# Patient Record
Sex: Female | Born: 1955 | Race: White | Hispanic: No | Smoking: Former smoker
Health system: Southern US, Community
[De-identification: ages and names within clinical notes are randomized; demographics above are authoritative.]

## PROBLEM LIST (undated history)

## (undated) DIAGNOSIS — R7303 Prediabetes: Secondary | ICD-10-CM

## (undated) DIAGNOSIS — I10 Essential (primary) hypertension: Secondary | ICD-10-CM

## (undated) DIAGNOSIS — F329 Major depressive disorder, single episode, unspecified: Secondary | ICD-10-CM

## (undated) DIAGNOSIS — K219 Gastro-esophageal reflux disease without esophagitis: Secondary | ICD-10-CM

## (undated) DIAGNOSIS — K449 Diaphragmatic hernia without obstruction or gangrene: Secondary | ICD-10-CM

## (undated) DIAGNOSIS — Z9889 Other specified postprocedural states: Secondary | ICD-10-CM

## (undated) DIAGNOSIS — F32A Depression, unspecified: Secondary | ICD-10-CM

## (undated) DIAGNOSIS — F419 Anxiety disorder, unspecified: Secondary | ICD-10-CM

## (undated) DIAGNOSIS — C801 Malignant (primary) neoplasm, unspecified: Secondary | ICD-10-CM

## (undated) DIAGNOSIS — M4800 Spinal stenosis, site unspecified: Secondary | ICD-10-CM

## (undated) DIAGNOSIS — L989 Disorder of the skin and subcutaneous tissue, unspecified: Secondary | ICD-10-CM

## (undated) DIAGNOSIS — R112 Nausea with vomiting, unspecified: Secondary | ICD-10-CM

## (undated) DIAGNOSIS — R0609 Other forms of dyspnea: Secondary | ICD-10-CM

## (undated) DIAGNOSIS — R519 Headache, unspecified: Secondary | ICD-10-CM

## (undated) DIAGNOSIS — M4310 Spondylolisthesis, site unspecified: Secondary | ICD-10-CM

## (undated) DIAGNOSIS — M1990 Unspecified osteoarthritis, unspecified site: Secondary | ICD-10-CM

## (undated) DIAGNOSIS — M5416 Radiculopathy, lumbar region: Secondary | ICD-10-CM

## (undated) HISTORY — PX: NO PAST SURGERIES: SHX2092

## (undated) HISTORY — PX: JOINT REPLACEMENT: SHX530

## (undated) HISTORY — PX: BACK SURGERY: SHX140

## (undated) HISTORY — PX: WISDOM TOOTH EXTRACTION: SHX21

---
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
## 2004-06-29 ENCOUNTER — Other Ambulatory Visit: Payer: Self-pay

## 2004-06-29 ENCOUNTER — Observation Stay: Payer: Self-pay | Admitting: Anesthesiology

## 2015-04-17 ENCOUNTER — Other Ambulatory Visit: Payer: Self-pay | Admitting: Internal Medicine

## 2015-04-17 DIAGNOSIS — M25512 Pain in left shoulder: Secondary | ICD-10-CM

## 2015-04-17 DIAGNOSIS — G8911 Acute pain due to trauma: Secondary | ICD-10-CM

## 2015-05-14 ENCOUNTER — Ambulatory Visit
Admission: RE | Admit: 2015-05-14 | Discharge: 2015-05-14 | Disposition: A | Discharge status: Home / Self Care | Payer: BC Managed Care – PPO | Source: Ambulatory Visit | Attending: Internal Medicine | Admitting: Internal Medicine

## 2015-05-14 DIAGNOSIS — M25512 Pain in left shoulder: Secondary | ICD-10-CM | POA: Diagnosis present

## 2015-05-14 DIAGNOSIS — G8911 Acute pain due to trauma: Secondary | ICD-10-CM | POA: Diagnosis not present

## 2015-05-14 DIAGNOSIS — R531 Weakness: Secondary | ICD-10-CM | POA: Insufficient documentation

## 2015-05-14 DIAGNOSIS — X58XXXA Exposure to other specified factors, initial encounter: Secondary | ICD-10-CM | POA: Insufficient documentation

## 2015-05-14 DIAGNOSIS — S43439A Superior glenoid labrum lesion of unspecified shoulder, initial encounter: Secondary | ICD-10-CM | POA: Insufficient documentation

## 2015-05-14 DIAGNOSIS — M19012 Primary osteoarthritis, left shoulder: Secondary | ICD-10-CM | POA: Insufficient documentation

## 2015-05-14 DIAGNOSIS — M7552 Bursitis of left shoulder: Secondary | ICD-10-CM | POA: Diagnosis not present

## 2016-10-21 DIAGNOSIS — I10 Essential (primary) hypertension: Secondary | ICD-10-CM | POA: Insufficient documentation

## 2017-01-18 DIAGNOSIS — F411 Generalized anxiety disorder: Secondary | ICD-10-CM | POA: Insufficient documentation

## 2017-02-02 LAB — HM DEXA SCAN: HM Dexa Scan: NORMAL

## 2017-02-14 ENCOUNTER — Emergency Department
Admission: EM | Admit: 2017-02-14 | Discharge: 2017-02-14 | Disposition: A | Discharge status: Home / Self Care | Payer: Worker's Compensation | Attending: Emergency Medicine | Admitting: Emergency Medicine

## 2017-02-14 ENCOUNTER — Encounter: Payer: Self-pay | Admitting: Emergency Medicine

## 2017-02-14 ENCOUNTER — Emergency Department: Payer: Worker's Compensation

## 2017-02-14 DIAGNOSIS — I10 Essential (primary) hypertension: Secondary | ICD-10-CM | POA: Insufficient documentation

## 2017-02-14 DIAGNOSIS — M545 Low back pain: Secondary | ICD-10-CM | POA: Insufficient documentation

## 2017-02-14 DIAGNOSIS — M25551 Pain in right hip: Secondary | ICD-10-CM | POA: Insufficient documentation

## 2017-02-14 DIAGNOSIS — Z87891 Personal history of nicotine dependence: Secondary | ICD-10-CM | POA: Insufficient documentation

## 2017-02-14 DIAGNOSIS — M549 Dorsalgia, unspecified: Secondary | ICD-10-CM

## 2017-02-14 HISTORY — DX: Unspecified osteoarthritis, unspecified site: M19.90

## 2017-02-14 HISTORY — DX: Essential (primary) hypertension: I10

## 2017-02-14 MED ORDER — NAPROXEN 500 MG PO TABS: 500.0000 mg | ORAL_TABLET | Freq: Two times a day (BID) | ORAL | 0 refills | Status: AC

## 2017-02-14 MED ORDER — HYDROMORPHONE HCL 1 MG/ML IJ SOLN
INTRAMUSCULAR | Status: AC
  Administered 2017-02-14: 1 mg via INTRAVENOUS
  Filled 2017-02-14: qty 1

## 2017-02-14 MED ORDER — KETOROLAC TROMETHAMINE 30 MG/ML IJ SOLN
30.0000 mg | Freq: Once | INTRAMUSCULAR | Status: AC
  Administered 2017-02-14: 30 mg via INTRAVENOUS
  Filled 2017-02-14: qty 1

## 2017-02-14 MED ORDER — HYDROMORPHONE HCL 1 MG/ML IJ SOLN
1.0000 mg | Freq: Once | INTRAMUSCULAR | Status: AC
  Administered 2017-02-14: 1 mg via INTRAVENOUS
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
## 2017-02-14 NOTE — ED Notes (Signed)
Resumed care from [facility].  Pt alert.  Family with pt.  Pt has back pain and right hip pain.  Pt waiting on xray.

## 2017-02-14 NOTE — ED Notes (Signed)
States feeling better after meds   Family with pt.

## 2017-02-14 NOTE — Discharge Instructions (Signed)
Please seek medical attention for any high fevers, chest pain, shortness of breath, change in behavior, persistent vomiting, bloody stool or any other new or concerning symptoms.  
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
## 2017-03-10 ENCOUNTER — Emergency Department
Admission: EM | Admit: 2017-03-10 | Discharge: 2017-03-10 | Disposition: A | Discharge status: Home / Self Care | Payer: BC Managed Care – PPO | Attending: Emergency Medicine | Admitting: Emergency Medicine

## 2017-03-10 ENCOUNTER — Encounter: Payer: Self-pay | Admitting: Emergency Medicine

## 2017-03-10 DIAGNOSIS — I9589 Other hypotension: Secondary | ICD-10-CM | POA: Insufficient documentation

## 2017-03-10 DIAGNOSIS — Z87891 Personal history of nicotine dependence: Secondary | ICD-10-CM | POA: Insufficient documentation

## 2017-03-10 DIAGNOSIS — Z79899 Other long term (current) drug therapy: Secondary | ICD-10-CM | POA: Insufficient documentation

## 2017-03-10 DIAGNOSIS — E861 Hypovolemia: Secondary | ICD-10-CM | POA: Diagnosis not present

## 2017-03-10 DIAGNOSIS — E860 Dehydration: Secondary | ICD-10-CM | POA: Diagnosis not present

## 2017-03-10 DIAGNOSIS — I959 Hypotension, unspecified: Secondary | ICD-10-CM | POA: Diagnosis present

## 2017-03-10 DIAGNOSIS — I10 Essential (primary) hypertension: Secondary | ICD-10-CM | POA: Insufficient documentation

## 2017-03-10 LAB — CBC
HEMATOCRIT: 46 % (ref 35.0–47.0)
HEMOGLOBIN: 15.3 g/dL (ref 12.0–16.0)
MCH: 28.3 pg (ref 26.0–34.0)
MCHC: 33.3 g/dL (ref 32.0–36.0)
MCV: 84.8 fL (ref 80.0–100.0)
Platelets: 210 10*3/uL (ref 150–440)
RBC: 5.42 MIL/uL — ABNORMAL HIGH (ref 3.80–5.20)
RDW: 13.2 % (ref 11.5–14.5)
WBC: 12.2 10*3/uL — ABNORMAL HIGH (ref 3.6–11.0)

## 2017-03-10 LAB — URINALYSIS, COMPLETE (UACMP) WITH MICROSCOPIC
BACTERIA UA: NONE SEEN
BILIRUBIN URINE: NEGATIVE
GLUCOSE, UA: NEGATIVE mg/dL
HGB URINE DIPSTICK: NEGATIVE
Ketones, ur: NEGATIVE mg/dL
LEUKOCYTES UA: NEGATIVE
NITRITE: NEGATIVE
Protein, ur: NEGATIVE mg/dL
SPECIFIC GRAVITY, URINE: 1.005 (ref 1.005–1.030)
pH: 5 (ref 5.0–8.0)

## 2017-03-10 LAB — BASIC METABOLIC PANEL
Anion gap: 9 (ref 5–15)
BUN: 17 mg/dL (ref 6–20)
CHLORIDE: 104 mmol/L (ref 101–111)
CO2: 24 mmol/L (ref 22–32)
Calcium: 8.9 mg/dL (ref 8.9–10.3)
Creatinine, Ser: 0.97 mg/dL (ref 0.44–1.00)
GFR calc Af Amer: >60 mL/min (ref >60)
GFR calc non Af Amer: >60 mL/min (ref >60)
GLUCOSE: 115 mg/dL — AB (ref 65–99)
POTASSIUM: 4 mmol/L (ref 3.5–5.1)
Sodium: 137 mmol/L (ref 135–145)

## 2017-03-10 LAB — TROPONIN I

## 2017-03-10 MED ORDER — AMLODIPINE BESYLATE 2.5 MG PO TABS: 2.5000 mg | ORAL_TABLET | Freq: Every day | ORAL | 0 refills | Status: DC

## 2017-03-10 MED ORDER — SODIUM CHLORIDE 0.9 % IV BOLUS (SEPSIS)
1000.0000 mL | Freq: Once | INTRAVENOUS | Status: AC
  Administered 2017-03-10: 1000 mL via INTRAVENOUS

## 2017-03-10 NOTE — ED Triage Notes (Signed)
Pt in via POV; sent over for [facility] GI office due to hypotension, reported 69/54.  Pt reports being there for an initial consult, feeling dizzy, weak, diaphoretic this morning.  Pt reports just recently taking her morning medication, recent increase in BP medication.  BP 85/58 upon arrival, other vitals WDL.  Pt alert, NAD noted at this time.

## 2017-03-10 NOTE — ED Notes (Signed)
AAOx3.  Skin warm and dry.  NAD 

## 2017-03-10 NOTE — ED Provider Notes (Signed)
-----------------------------------------   5:32 PM on 03/10/2017 -----------------------------------------  Patient's blood pressure is currently 160/82, heart rate is 62.  Patient appears extremely well, has no complaints at this time.  I discussed with the patient decreasing her amlodipine back to 2.5 mg starting tomorrow as well as increasing oral hydration.  Patient has a blood pressure cuff at home I instructed her to check her blood pressure several times over the next few days to ensure that it does not drop low again and to return to the emergency department for any significant hypotension, lightheaded or dizziness.  Patient agreeable to this plan of care.   Harvest Dark, MD 03/10/17 [phone]

## 2017-03-10 NOTE — Discharge Instructions (Signed)
Cut your norvasc in half and take 2.5mg  daily until you are able to follow up with your doctor in the next 2 days. Return to the ER for chest pain,

## 2017-03-10 NOTE — ED Provider Notes (Signed)
[facility] Emergency Department Provider Note  ____________________________________________  Time seen: Approximately 2:59 PM  I have reviewed the triage vital signs and the nursing notes.   HISTORY  Chief Complaint Hypotension   HPI Melissa [name] is a 61 y.o. female with a history of hypertension and arthritis who presents for evaluation of hypotension. Patient was at a regular checkup with her GI doctor which was found to be hypotensive with systolics in the 85I. Patient denies feeling dizzy, having any shortness of breath or chest pain. She was sent here for evaluation. Patient reports that 2 months ago her primary care doctor increased her amlodipine dose from 2.5 - 5 mg. She denies any recent weight loss.Patient reports that yesterday she had an upset stomach and had 3-4 episodes of nonbloody nonbilious emesis and 3-4 episodes of watery diarrhea. She hasn't had anything to eat or drink today and took her amlodipine this morning. She is no longer having vomiting and diarrhea. She has no abdominal pain, no fever or chills, no URI symptoms, no chest pain or shortness of breath.  Past Medical History:  Diagnosis Date  . Arthritis   . HTN (hypertension)     There are no active problems to display for this patient.   History reviewed. No pertinent surgical history.  Prior to Admission medications   Medication Sig Start Date End Date Taking? Authorizing Provider  amLODipine (NORVASC) 2.5 MG tablet Take 1 tablet (2.5 mg total) daily by mouth. 03/10/17   Alfred Levins, [place], MD  busPIRone (BUSPAR) 7.5 MG tablet Take 7.5 mg by mouth 2 (two) times daily. 01/18/17   [provider]  escitalopram (LEXAPRO) 20 MG tablet Take 20 mg by mouth daily. 01/24/17   [provider]  fluticasone (FLONASE) 50 MCG/ACT nasal spray Place 2 sprays into both nostrils daily. 02/04/17   [provider]  meloxicam (MOBIC) 15 MG tablet Take 15 mg by mouth  daily. 12/27/16   [provider]  naproxen (NAPROSYN) 500 MG tablet Take 1 tablet (500 mg total) by mouth 2 (two) times daily with a meal. 02/14/17 02/14/18  Nance Pear, MD  tiZANidine (ZANAFLEX) 4 MG tablet Take 8 mg by mouth 3 (three) times daily.  02/09/17   [provider]    Allergies Patient has no known allergies.  No family history on file.  Social History Social History   Tobacco Use  . Smoking status: Former Research scientist (life sciences)  . Smokeless tobacco: Never Used  Substance Use Topics  . Alcohol use: No  . Drug use: No    Review of Systems  Constitutional: Negative for fever. Eyes: Negative for visual changes. ENT: Negative for sore throat. Neck: No neck pain  Cardiovascular: Negative for chest pain. Respiratory: Negative for shortness of breath. Gastrointestinal: Negative for abdominal pain. + vomiting and diarrhea. Genitourinary: Negative for dysuria. Musculoskeletal: Negative for back pain. Skin: Negative for rash. Neurological: Negative for headaches, weakness or numbness. Psych: No SI or HI  ____________________________________________   PHYSICAL EXAM:  VITAL SIGNS: ED Triage Vitals  Enc Vitals Group     BP 03/10/17 1120 (!) 85/58     Pulse Rate 03/10/17 1120 (!) 58     Resp 03/10/17 1120 16     Temp 03/10/17 1120 98.2 F (36.8 C)     Temp Source 03/10/17 1120 Oral     SpO2 03/10/17 1120 97 %     Weight 03/10/17 1121 225 lb (102.1 kg)     Height 03/10/17 1121  5\' 5"  (1.651 m)     Head Circumference --      Peak Flow --      Pain Score 03/10/17 1120 0     Pain Loc --      Pain Edu? --      Excl. in [place]? --     Constitutional: Alert and oriented. Well appearing and in no apparent distress. HEENT:      Head: Normocephalic and atraumatic.         Eyes: Conjunctivae are normal. Sclera is non-icteric.       Mouth/Throat: Mucous membranes are moist.       Neck: Supple with no signs of meningismus. Cardiovascular: Regular rate and rhythm.  No murmurs, gallops, or rubs. 2+ symmetrical distal pulses are present in all extremities. No JVD. Respiratory: Normal respiratory effort. Lungs are clear to auscultation bilaterally. No wheezes, crackles, or rhonchi.  Gastrointestinal: Soft, non tender, and non distended with positive bowel sounds. No rebound or guarding. Genitourinary: No CVA tenderness. Musculoskeletal: Nontender with normal range of motion in all extremities. No edema, cyanosis, or erythema of extremities. Neurologic: Normal speech and language. Face is symmetric. Moving all extremities. No gross focal neurologic deficits are appreciated. Skin: Skin is warm, dry and intact. No rash noted. Psychiatric: Mood and affect are normal. Speech and behavior are normal.  ____________________________________________   LABS (all labs ordered are listed, but only abnormal results are displayed)  Labs Reviewed  BASIC METABOLIC PANEL - Abnormal; Notable for the following components:      Result Value   Glucose, Bld 115 (*)    All other components within normal limits  CBC - Abnormal; Notable for the following components:   WBC 12.2 (*)    RBC 5.42 (*)    All other components within normal limits  TROPONIN I  URINALYSIS, COMPLETE (UACMP) WITH MICROSCOPIC  CBG MONITORING, ED   ____________________________________________  EKG  ED ECG REPORT I, Rudene Re, the attending physician, personally viewed and interpreted this ECG.  Normal sinus rhythm, rate of 61, normal intervals, normal axis, no ST elevations or depressions. ____________________________________________  RADIOLOGY  none  ____________________________________________   PROCEDURES  Procedure(s) performed: None Procedures Critical Care performed:  None ____________________________________________   INITIAL IMPRESSION / ASSESSMENT AND PLAN / ED COURSE  61 y.o. female with a history of hypertension and arthritis who presents for evaluation of  hypotension at the doctor's office while there for regular check up. Here BP low 85/58. Patient endorses having several episodes of vomiting and diarrhea last night, hasn't had anything to eat or drink today and took her amlodipine therefore believe her hypotension is due to all of these factors. Patient looks well appearing, is asymptomatic, her EKG shows no ischemia, her blood pressure has improved significantly with a liter of fluid. Her labs are within normal limits other than mild hemoconcentration. We'll give her another bag of fluids and continue to monitor. If her blood pressure remaines stable patient be discharged back home. We'll recommend that she cuts her amlodipine and half until she is able to follow up with her primary care doctor.      As part of my medical decision making, I reviewed the following data within the [place] notes reviewed and incorporated, Labs reviewed , EKG interpreted , Patient signed out to Dr. Kerman Passey, Notes from prior ED visits and [place] Controlled Substance Database    Pertinent labs & imaging results that were available during my care of the  patient were reviewed by me and considered in my medical decision making (see chart for details).    ____________________________________________   FINAL CLINICAL IMPRESSION(S) / ED DIAGNOSES  Final diagnoses:  Hypotension due to hypovolemia  Dehydration      NEW MEDICATIONS STARTED DURING THIS VISIT:  This SmartLink is deprecated. Use AVSMEDLIST instead to display the medication list for a patient.   Note:  This document was prepared using Dragon voice recognition software and may include unintentional dictation errors.    Rudene Re, MD 03/10/17 [phone]

## 2017-05-20 DIAGNOSIS — Z6841 Body Mass Index (BMI) 40.0 and over, adult: Secondary | ICD-10-CM | POA: Insufficient documentation

## 2018-01-20 ENCOUNTER — Other Ambulatory Visit: Payer: Self-pay | Admitting: Internal Medicine

## 2018-01-20 DIAGNOSIS — Z1231 Encounter for screening mammogram for malignant neoplasm of breast: Secondary | ICD-10-CM

## 2018-12-06 ENCOUNTER — Other Ambulatory Visit: Payer: Self-pay | Admitting: Physical Medicine and Rehabilitation

## 2018-12-06 DIAGNOSIS — M5136 Other intervertebral disc degeneration, lumbar region: Secondary | ICD-10-CM

## 2018-12-16 ENCOUNTER — Ambulatory Visit: Payer: BC Managed Care – PPO

## 2019-01-06 ENCOUNTER — Other Ambulatory Visit: Payer: Self-pay

## 2019-01-06 ENCOUNTER — Ambulatory Visit
Admission: RE | Admit: 2019-01-06 | Discharge: 2019-01-06 | Disposition: A | Discharge status: Home / Self Care | Payer: BC Managed Care – PPO | Source: Ambulatory Visit | Attending: Physical Medicine and Rehabilitation | Admitting: Physical Medicine and Rehabilitation

## 2019-01-06 DIAGNOSIS — M5136 Other intervertebral disc degeneration, lumbar region: Secondary | ICD-10-CM | POA: Insufficient documentation

## 2019-01-23 DIAGNOSIS — F3340 Major depressive disorder, recurrent, in remission, unspecified: Secondary | ICD-10-CM | POA: Insufficient documentation

## 2019-02-14 ENCOUNTER — Other Ambulatory Visit: Payer: Self-pay | Admitting: Internal Medicine

## 2019-02-14 DIAGNOSIS — Z1231 Encounter for screening mammogram for malignant neoplasm of breast: Secondary | ICD-10-CM

## 2019-06-13 IMAGING — CR DG LUMBAR SPINE COMPLETE 4+V
5 series · 5 of 5 positions shown · non-contrast
Comparison: None.

CLINICAL DATA: Back pain after fall.

EXAM:
LUMBAR SPINE - COMPLETE 4+ VIEW

[l-spine ap]
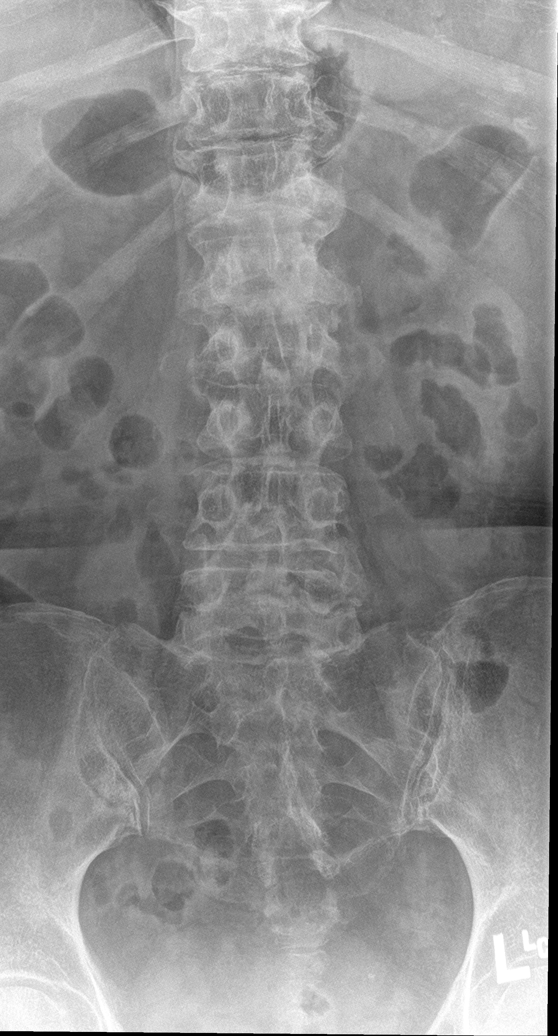

[l-spine obl (1 of 2)]
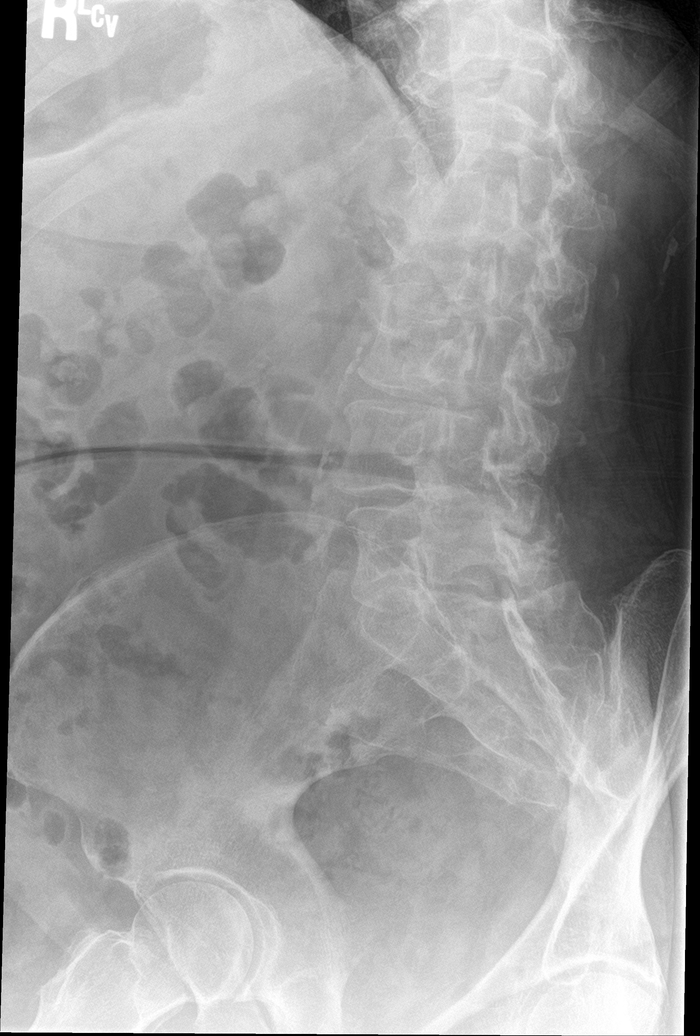

[l-spine obl (2 of 2)]
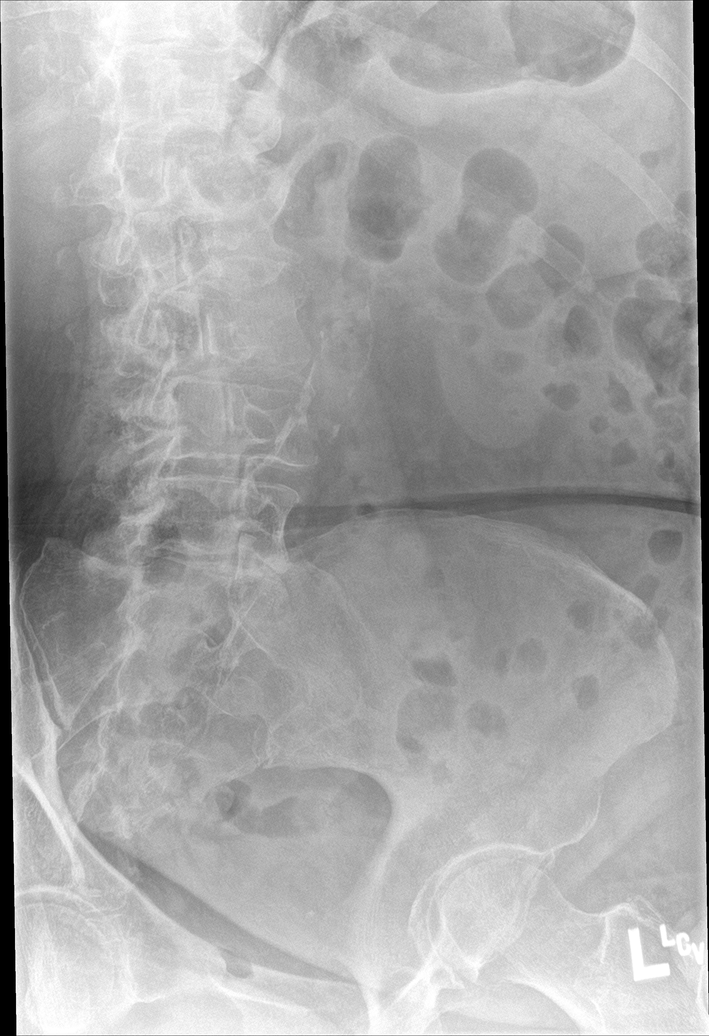

[l-spine lat]
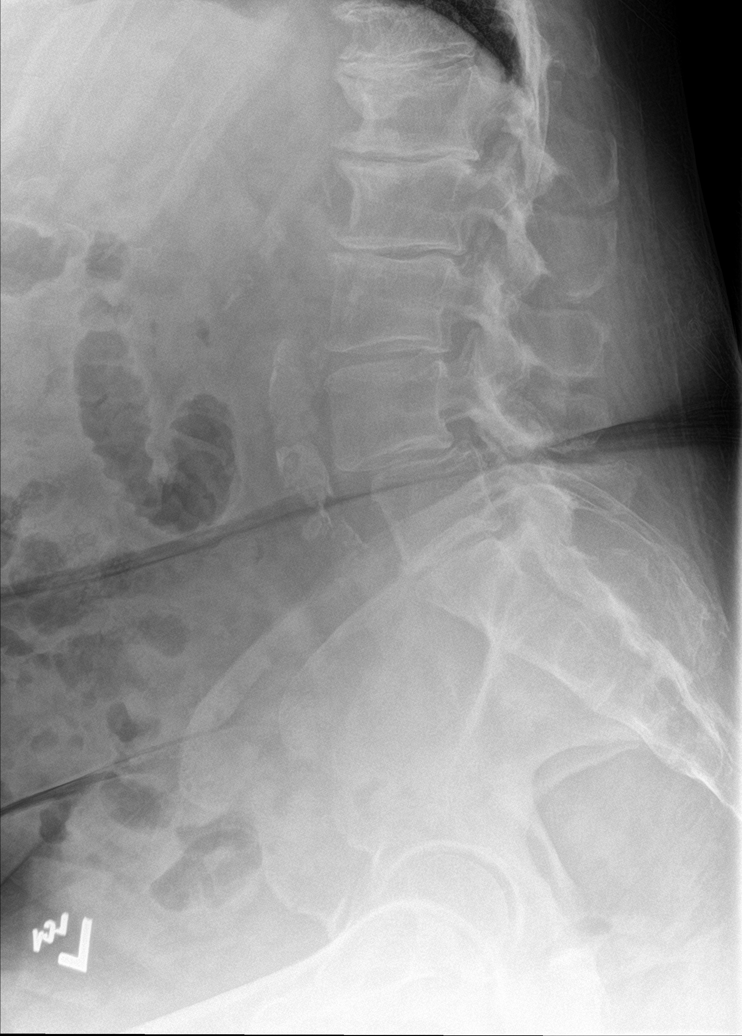

[l-spine spot]
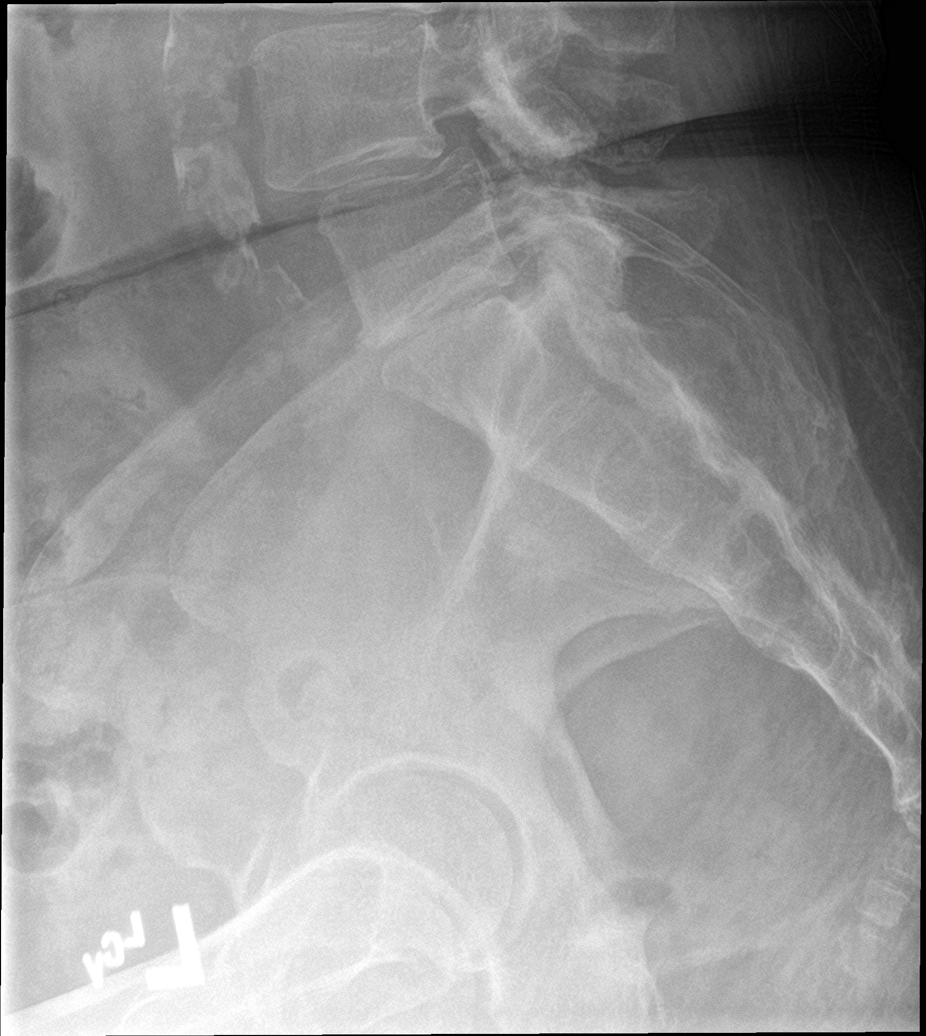

[5 of 5 positions shown; findings below may reference images not displayed]

FINDINGS: No acute fracture or malalignment. Vertebral body heights are
preserved. 6 mm anterolisthesis of L4 on L5, favored degenerative
due to bilateral facet arthropathy. Moderate multilevel disc height
loss and endplate spurring throughout the lumbar spine. Diffuse
osteopenia. Atherosclerotic vascular calcifications. The sacroiliac
joints are intact.
IMPRESSION: 1. No acute osseous abnormality. Moderate multilevel degenerative
changes throughout the lumbar spine.
2. Grade 1 anterolisthesis of L4 on L5, likely degenerative.

## 2019-08-08 ENCOUNTER — Other Ambulatory Visit: Payer: Self-pay | Admitting: Neurosurgery

## 2019-09-03 ENCOUNTER — Other Ambulatory Visit: Payer: Self-pay

## 2019-09-03 ENCOUNTER — Encounter
Admission: RE | Admit: 2019-09-03 | Discharge: 2019-09-03 | Disposition: A | Discharge status: Home / Self Care | Payer: BC Managed Care – PPO | Source: Ambulatory Visit | Attending: Neurosurgery | Admitting: Neurosurgery

## 2019-09-03 HISTORY — DX: Gastro-esophageal reflux disease without esophagitis: K21.9

## 2019-09-03 HISTORY — DX: Anxiety disorder, unspecified: F41.9

## 2019-09-03 HISTORY — DX: Depression, unspecified: F32.A

## 2019-09-03 HISTORY — DX: Spinal stenosis, site unspecified: M48.00

## 2019-09-03 NOTE — Patient Instructions (Addendum)
COVID TESTING Date: Monday, May 17 Testing site:  [facility] [address] Thru Hours:  [id number] am - 1:00 pm Once you are tested, you are asked to stay quarantined (avoiding public places) until after your surgery.   Your procedure is scheduled on: Wednesday, May 19 Report to Day Surgery on the 2nd floor of the [facility]. To find out your arrival time, please call [phone] between 1PM - 3PM on: Tuesday, May 18  REMEMBER: Instructions that are not followed completely may result in serious medical risk, up to and including death; or upon the discretion of your surgeon and anesthesiologist your surgery may need to be rescheduled.  Do not eat food after midnight the night before surgery.  No gum chewing, lozengers or hard candies.  You may however, drink CLEAR liquids up to 2 hours before you are scheduled to arrive for your surgery. Do not drink anything within 2 hours of your scheduled arrival time.  Clear liquids include: - water  - apple juice without pulp - gatorade (not RED) - black coffee or tea (Do NOT add milk or creamers to the coffee or tea) Do NOT drink anything that is not on this list.  DO NOT TAKE ANY MEDICATIONS THE MORNING OF SURGERY   Stop Anti-inflammatories (NSAIDS) such as Advil, Aleve, Ibuprofen, Motrin, Naproxen, Naprosyn and Aspirin based products such as Excedrin, Goodys Powder, BC Powder. (May take Tylenol or Acetaminophen if needed.)  Stop ANY OVER THE COUNTER supplements until after surgery.  No Alcohol for 24 hours before or after surgery.  On the morning of surgery brush your teeth with toothpaste and water, you may rinse your mouth with mouthwash if you wish. Do not swallow any toothpaste or mouthwash.  Do not wear jewelry, make-up, hairpins, clips or nail polish.  Do not wear lotions, powders, or perfumes.   Do not shave 48 hours prior to surgery.   Dentures may not be worn into surgery.  Do not bring  valuables to the hospital. [facility] is not responsible for any missing/lost belongings or valuables.   Use CHG Soap as directed on instruction sheet.  Notify your doctor if there is any change in your medical condition (cold, fever, infection).  Wear comfortable clothing (specific to your surgery type) to the hospital.  Plan for stool softeners for home use; pain medications have a tendency to cause constipation. You can also help prevent constipation by eating foods high in fiber such as fruits and vegetables and drinking plenty of fluids as your diet allows.  After surgery, you can help prevent lung complications by doing breathing exercises.  Take deep breaths and cough every 1-2 hours. Your doctor may order a device called an Incentive Spirometer to help you take deep breaths.  If you are being admitted to the hospital overnight, leave your suitcase in the car. After surgery it may be brought to your room.  If you are taking public transportation, you will need to have a responsible adult (18 years or older) with you. Please confirm with your physician that it is acceptable to use public transportation.   Please call the [place] [facility] at [phone] if you have any questions about these instructions.  Visitation Policy:  Patients undergoing a surgery or procedure may have one family member or support person with them as long as that person is not COVID-19 positive or experiencing its symptoms.  That person may remain in the waiting area during the procedure.  Inpatient Visitation Update:  Two designated support people may visit a patient during visiting hours 7 am to 8 pm. It must be the same two designated people for the duration of the patient stay. The visitors may come and go during the day, and there is no switching out to have different visitors. A mask must be worn at all times, including in the patient room.

## 2019-09-04 ENCOUNTER — Encounter
Admission: RE | Admit: 2019-09-04 | Discharge: 2019-09-04 | Disposition: A | Discharge status: Home / Self Care | Payer: BC Managed Care – PPO | Source: Ambulatory Visit | Attending: Neurosurgery | Admitting: Neurosurgery

## 2019-09-04 DIAGNOSIS — I10 Essential (primary) hypertension: Secondary | ICD-10-CM | POA: Diagnosis not present

## 2019-09-04 DIAGNOSIS — Z01818 Encounter for other preprocedural examination: Secondary | ICD-10-CM | POA: Diagnosis not present

## 2019-09-04 LAB — CBC
HCT: 43.4 % (ref 36.0–46.0)
Hemoglobin: 14.4 g/dL (ref 12.0–15.0)
MCH: 28.8 pg (ref 26.0–34.0)
MCHC: 33.2 g/dL (ref 30.0–36.0)
MCV: 86.8 fL (ref 80.0–100.0)
Platelets: 215 10*3/uL (ref 150–400)
RBC: 5 MIL/uL (ref 3.87–5.11)
RDW: 13.3 % (ref 11.5–15.5)
WBC: 11.6 10*3/uL — ABNORMAL HIGH (ref 4.0–10.5)
nRBC: 0 % (ref 0.0–0.2)

## 2019-09-04 LAB — URINALYSIS, ROUTINE W REFLEX MICROSCOPIC
Bilirubin Urine: NEGATIVE
Glucose, UA: NEGATIVE mg/dL
Hgb urine dipstick: NEGATIVE
Ketones, ur: NEGATIVE mg/dL
Leukocytes,Ua: NEGATIVE
Nitrite: NEGATIVE
Protein, ur: NEGATIVE mg/dL
Specific Gravity, Urine: 1.018 (ref 1.005–1.030)
pH: 5 (ref 5.0–8.0)

## 2019-09-04 LAB — APTT: aPTT: 31 seconds (ref 24–36)

## 2019-09-04 LAB — BASIC METABOLIC PANEL
Anion gap: 8 (ref 5–15)
BUN: 28 mg/dL — ABNORMAL HIGH (ref 8–23)
CO2: 29 mmol/L (ref 22–32)
Calcium: 9.1 mg/dL (ref 8.9–10.3)
Chloride: 100 mmol/L (ref 98–111)
Creatinine, Ser: 0.99 mg/dL (ref 0.44–1.00)
GFR calc Af Amer: >60 mL/min (ref >60)
GFR calc non Af Amer: >60 mL/min (ref >60)
Glucose, Bld: 86 mg/dL (ref 70–99)
Potassium: 3.9 mmol/L (ref 3.5–5.1)
Sodium: 137 mmol/L (ref 135–145)

## 2019-09-04 LAB — TYPE AND SCREEN
ABO/RH(D): O POS
Antibody Screen: NEGATIVE

## 2019-09-04 LAB — PROTIME-INR
INR: 1 (ref 0.8–1.2)
Prothrombin Time: 12.8 seconds (ref 11.4–15.2)

## 2019-09-04 LAB — SURGICAL PCR SCREEN
MRSA, PCR: NEGATIVE
Staphylococcus aureus: NEGATIVE

## 2019-09-04 NOTE — Pre-Procedure Instructions (Signed)
Pre-Admit Testing Provider Notification Note  Provider Notified: [place]  Notification Mode: Fax  Reason: Abnormal lab result.  Response: Fax confirmation received.   Additional Information: Placed on chart. Noted on Pre-Admit Worksheet.  Signed:  , RN  

## 2019-09-10 ENCOUNTER — Other Ambulatory Visit
Admission: RE | Admit: 2019-09-10 | Discharge: 2019-09-10 | Disposition: A | Discharge status: Home / Self Care | Payer: BC Managed Care – PPO | Source: Ambulatory Visit | Attending: Neurosurgery | Admitting: Neurosurgery

## 2019-09-10 ENCOUNTER — Other Ambulatory Visit: Payer: Self-pay

## 2019-09-10 DIAGNOSIS — Z01812 Encounter for preprocedural laboratory examination: Secondary | ICD-10-CM | POA: Insufficient documentation

## 2019-09-10 DIAGNOSIS — Z20822 Contact with and (suspected) exposure to covid-19: Secondary | ICD-10-CM | POA: Insufficient documentation

## 2019-09-10 LAB — SARS CORONAVIRUS 2 (TAT 6-24 HRS): SARS Coronavirus 2: NEGATIVE

## 2019-09-12 ENCOUNTER — Inpatient Hospital Stay
Admission: RE | Admit: 2019-09-12 | Discharge: 2019-09-13 | DRG: 460 | Disposition: A | Discharge status: Home / Self Care | Payer: BC Managed Care – PPO | Attending: Neurosurgery | Admitting: Neurosurgery

## 2019-09-12 ENCOUNTER — Other Ambulatory Visit: Payer: Self-pay

## 2019-09-12 ENCOUNTER — Encounter: Payer: Self-pay | Admitting: Neurosurgery

## 2019-09-12 ENCOUNTER — Inpatient Hospital Stay: Payer: BC Managed Care – PPO | Admitting: Certified Registered Nurse Anesthetist

## 2019-09-12 ENCOUNTER — Encounter
Admission: RE | Disposition: A | Discharge status: Home / Self Care | Payer: Self-pay | Source: Home / Self Care | Attending: Neurosurgery

## 2019-09-12 ENCOUNTER — Inpatient Hospital Stay: Payer: BC Managed Care – PPO

## 2019-09-12 DIAGNOSIS — Z87891 Personal history of nicotine dependence: Secondary | ICD-10-CM

## 2019-09-12 DIAGNOSIS — Z79899 Other long term (current) drug therapy: Secondary | ICD-10-CM | POA: Diagnosis not present

## 2019-09-12 DIAGNOSIS — Z8261 Family history of arthritis: Secondary | ICD-10-CM

## 2019-09-12 DIAGNOSIS — M47816 Spondylosis without myelopathy or radiculopathy, lumbar region: Secondary | ICD-10-CM | POA: Diagnosis present

## 2019-09-12 DIAGNOSIS — Z419 Encounter for procedure for purposes other than remedying health state, unspecified: Secondary | ICD-10-CM

## 2019-09-12 DIAGNOSIS — M5416 Radiculopathy, lumbar region: Secondary | ICD-10-CM | POA: Diagnosis present

## 2019-09-12 DIAGNOSIS — M48061 Spinal stenosis, lumbar region without neurogenic claudication: Secondary | ICD-10-CM | POA: Diagnosis present

## 2019-09-12 DIAGNOSIS — Z20822 Contact with and (suspected) exposure to covid-19: Secondary | ICD-10-CM | POA: Diagnosis present

## 2019-09-12 DIAGNOSIS — I10 Essential (primary) hypertension: Secondary | ICD-10-CM | POA: Diagnosis present

## 2019-09-12 DIAGNOSIS — M4310 Spondylolisthesis, site unspecified: Secondary | ICD-10-CM | POA: Diagnosis present

## 2019-09-12 DIAGNOSIS — K219 Gastro-esophageal reflux disease without esophagitis: Secondary | ICD-10-CM | POA: Diagnosis present

## 2019-09-12 DIAGNOSIS — Z820 Family history of epilepsy and other diseases of the nervous system: Secondary | ICD-10-CM | POA: Diagnosis not present

## 2019-09-12 DIAGNOSIS — Z981 Arthrodesis status: Secondary | ICD-10-CM

## 2019-09-12 DIAGNOSIS — Z818 Family history of other mental and behavioral disorders: Secondary | ICD-10-CM

## 2019-09-12 DIAGNOSIS — Z8249 Family history of ischemic heart disease and other diseases of the circulatory system: Secondary | ICD-10-CM

## 2019-09-12 DIAGNOSIS — Z823 Family history of stroke: Secondary | ICD-10-CM

## 2019-09-12 DIAGNOSIS — M4316 Spondylolisthesis, lumbar region: Principal | ICD-10-CM | POA: Diagnosis present

## 2019-09-12 DIAGNOSIS — F329 Major depressive disorder, single episode, unspecified: Secondary | ICD-10-CM | POA: Diagnosis present

## 2019-09-12 HISTORY — PX: SPINE SURGERY: SHX786

## 2019-09-12 LAB — ABO/RH: ABO/RH(D): O POS

## 2019-09-12 SURGERY — POSTERIOR LUMBAR FUSION 1 LEVEL
Anesthesia: General | Laterality: Left

## 2019-09-12 MED ORDER — ACETAMINOPHEN 10 MG/ML IV SOLN: 1000.0000 mg | Freq: Once | INTRAVENOUS | Status: DC | PRN

## 2019-09-12 MED ORDER — METHOCARBAMOL 500 MG PO TABS: 500.0000 mg | ORAL_TABLET | Freq: Four times a day (QID) | ORAL | Status: DC | PRN

## 2019-09-12 MED ORDER — BUPIVACAINE-EPINEPHRINE 0.5% -1:200000 IJ SOLN
INTRAMUSCULAR | Status: DC | PRN
  Administered 2019-09-12: 13 mL

## 2019-09-12 MED ORDER — HYDROMORPHONE HCL 1 MG/ML IJ SOLN
0.5000 mg | INTRAMUSCULAR | Status: AC | PRN
  Administered 2019-09-12 (×2): 0.5 mg via INTRAVENOUS

## 2019-09-12 MED ORDER — ONDANSETRON HCL 4 MG/2ML IJ SOLN
INTRAMUSCULAR | Status: DC | PRN
  Administered 2019-09-12: 4 mg via INTRAVENOUS

## 2019-09-12 MED ORDER — MENTHOL 3 MG MT LOZG
1.0000 | LOZENGE | OROMUCOSAL | Status: DC | PRN
  Filled 2019-09-12: qty 9

## 2019-09-12 MED ORDER — HYDROMORPHONE HCL 1 MG/ML IJ SOLN
INTRAMUSCULAR | Status: AC
  Administered 2019-09-12: 0.5 mg via INTRAVENOUS
  Filled 2019-09-12: qty 1

## 2019-09-12 MED ORDER — VANCOMYCIN HCL 1500 MG/300ML IV SOLN
1500.0000 mg | INTRAVENOUS | Status: AC
  Administered 2019-09-12: 1500 mg via INTRAVENOUS
  Filled 2019-09-12: qty 300

## 2019-09-12 MED ORDER — DULOXETINE HCL 60 MG PO CPEP
60.0000 mg | ORAL_CAPSULE | Freq: Every evening | ORAL | Status: DC
  Administered 2019-09-12: 60 mg via ORAL
  Filled 2019-09-12 (×2): qty 1

## 2019-09-12 MED ORDER — PHENOL 1.4 % MT LIQD
1.0000 | OROMUCOSAL | Status: DC | PRN
  Filled 2019-09-12: qty 177

## 2019-09-12 MED ORDER — SODIUM CHLORIDE 0.9 % IV SOLN
INTRAVENOUS | Status: DC | PRN
  Administered 2019-09-12: 40 mL

## 2019-09-12 MED ORDER — DEXAMETHASONE SODIUM PHOSPHATE 10 MG/ML IJ SOLN
INTRAMUSCULAR | Status: DC | PRN
  Administered 2019-09-12: 10 mg via INTRAVENOUS

## 2019-09-12 MED ORDER — HYDROMORPHONE HCL 1 MG/ML IJ SOLN: 0.5000 mg | INTRAMUSCULAR | Status: DC | PRN

## 2019-09-12 MED ORDER — SODIUM CHLORIDE 0.9% FLUSH
3.0000 mL | Freq: Two times a day (BID) | INTRAVENOUS | Status: DC
  Administered 2019-09-12: 3 mL via INTRAVENOUS

## 2019-09-12 MED ORDER — ONDANSETRON HCL 4 MG/2ML IJ SOLN: 4.0000 mg | Freq: Once | INTRAMUSCULAR | Status: DC | PRN

## 2019-09-12 MED ORDER — LACTATED RINGERS IV SOLN: INTRAVENOUS | Status: DC | PRN

## 2019-09-12 MED ORDER — ROCURONIUM BROMIDE 100 MG/10ML IV SOLN
INTRAVENOUS | Status: DC | PRN
  Administered 2019-09-12: 30 mg via INTRAVENOUS

## 2019-09-12 MED ORDER — POLYETHYLENE GLYCOL 3350 17 G PO PACK: 17.0000 g | PACK | Freq: Every day | ORAL | Status: DC | PRN

## 2019-09-12 MED ORDER — BUPIVACAINE HCL (PF) 0.5 % IJ SOLN
INTRAMUSCULAR | Status: DC | PRN
  Administered 2019-09-12: 20 mL

## 2019-09-12 MED ORDER — SODIUM CHLORIDE 0.9 % IR SOLN
Status: DC | PRN
  Administered 2019-09-12: 1000 mL

## 2019-09-12 MED ORDER — SENNA 8.6 MG PO TABS
1.0000 | ORAL_TABLET | Freq: Two times a day (BID) | ORAL | Status: DC
  Administered 2019-09-12 – 2019-09-13 (×2): 8.6 mg via ORAL
  Filled 2019-09-12 (×2): qty 1

## 2019-09-12 MED ORDER — KETOROLAC TROMETHAMINE 30 MG/ML IJ SOLN
INTRAMUSCULAR | Status: DC | PRN
  Administered 2019-09-12: 30 mg via INTRAVENOUS

## 2019-09-12 MED ORDER — ALPRAZOLAM 0.5 MG PO TABS: 0.5000 mg | ORAL_TABLET | Freq: Every evening | ORAL | Status: DC | PRN

## 2019-09-12 MED ORDER — FLEET ENEMA 7-19 GM/118ML RE ENEM: 1.0000 | ENEMA | Freq: Once | RECTAL | Status: DC | PRN

## 2019-09-12 MED ORDER — REMIFENTANIL HCL 1 MG IV SOLR
INTRAVENOUS | Status: DC | PRN
  Administered 2019-09-12: .1 ug/kg/min via INTRAVENOUS

## 2019-09-12 MED ORDER — FENTANYL CITRATE (PF) 100 MCG/2ML IJ SOLN
INTRAMUSCULAR | Status: DC | PRN
  Administered 2019-09-12 (×4): 50 ug via INTRAVENOUS

## 2019-09-12 MED ORDER — ACETAMINOPHEN 10 MG/ML IV SOLN
INTRAVENOUS | Status: DC | PRN
  Administered 2019-09-12: 1000 mg via INTRAVENOUS

## 2019-09-12 MED ORDER — CEFAZOLIN SODIUM-DEXTROSE 2-4 GM/100ML-% IV SOLN
INTRAVENOUS | Status: AC
  Filled 2019-09-12: qty 100

## 2019-09-12 MED ORDER — MIDAZOLAM HCL 2 MG/2ML IJ SOLN: 1.0000 mg | Freq: Once | INTRAMUSCULAR | Status: AC

## 2019-09-12 MED ORDER — BISACODYL 10 MG RE SUPP: 10.0000 mg | Freq: Every day | RECTAL | Status: DC | PRN

## 2019-09-12 MED ORDER — ACETAMINOPHEN 325 MG PO TABS: 650.0000 mg | ORAL_TABLET | ORAL | Status: DC | PRN

## 2019-09-12 MED ORDER — MIDAZOLAM HCL 2 MG/2ML IJ SOLN
INTRAMUSCULAR | Status: AC
  Filled 2019-09-12: qty 2

## 2019-09-12 MED ORDER — SEVOFLURANE IN SOLN
RESPIRATORY_TRACT | Status: AC
  Filled 2019-09-12: qty 250

## 2019-09-12 MED ORDER — MIDAZOLAM HCL 2 MG/2ML IJ SOLN
INTRAMUSCULAR | Status: AC
  Administered 2019-09-12: 1 mg via INTRAVENOUS
  Filled 2019-09-12: qty 2

## 2019-09-12 MED ORDER — OXYCODONE HCL 5 MG PO TABS: 5.0000 mg | ORAL_TABLET | Freq: Once | ORAL | Status: AC | PRN

## 2019-09-12 MED ORDER — ONDANSETRON HCL 4 MG/2ML IJ SOLN: 4.0000 mg | Freq: Four times a day (QID) | INTRAMUSCULAR | Status: DC | PRN

## 2019-09-12 MED ORDER — SODIUM CHLORIDE 0.9 % IV SOLN
250.0000 mL | INTRAVENOUS | Status: DC
  Administered 2019-09-12: 250 mL via INTRAVENOUS

## 2019-09-12 MED ORDER — REMIFENTANIL HCL 1 MG IV SOLR
INTRAVENOUS | Status: AC
  Filled 2019-09-12: qty 1000

## 2019-09-12 MED ORDER — GLYCOPYRROLATE 0.2 MG/ML IJ SOLN
INTRAMUSCULAR | Status: DC | PRN
  Administered 2019-09-12: .2 mg via INTRAVENOUS

## 2019-09-12 MED ORDER — PROPOFOL 500 MG/50ML IV EMUL
INTRAVENOUS | Status: DC | PRN
  Administered 2019-09-12: 125 ug/kg/min via INTRAVENOUS

## 2019-09-12 MED ORDER — FENTANYL CITRATE (PF) 100 MCG/2ML IJ SOLN
INTRAMUSCULAR | Status: AC
  Filled 2019-09-12: qty 2

## 2019-09-12 MED ORDER — FAMOTIDINE 20 MG PO TABS
ORAL_TABLET | ORAL | Status: AC
  Administered 2019-09-12: 20 mg via ORAL
  Filled 2019-09-12: qty 1

## 2019-09-12 MED ORDER — THROMBIN 5000 UNITS EX SOLR
CUTANEOUS | Status: DC | PRN
  Administered 2019-09-12: 5000 [IU] via TOPICAL

## 2019-09-12 MED ORDER — SODIUM CHLORIDE 0.9% FLUSH: 3.0000 mL | INTRAVENOUS | Status: DC | PRN

## 2019-09-12 MED ORDER — SODIUM CHLORIDE 0.9 % IV SOLN
INTRAVENOUS | Status: DC | PRN
  Administered 2019-09-12: 50 ug/min via INTRAVENOUS

## 2019-09-12 MED ORDER — SUCCINYLCHOLINE CHLORIDE 20 MG/ML IJ SOLN
INTRAMUSCULAR | Status: DC | PRN
  Administered 2019-09-12: 100 mg via INTRAVENOUS

## 2019-09-12 MED ORDER — KETOROLAC TROMETHAMINE 30 MG/ML IJ SOLN
INTRAMUSCULAR | Status: AC
  Filled 2019-09-12: qty 1

## 2019-09-12 MED ORDER — PROPOFOL 10 MG/ML IV BOLUS
INTRAVENOUS | Status: AC
  Filled 2019-09-12: qty 40

## 2019-09-12 MED ORDER — OXYCODONE HCL 5 MG/5ML PO SOLN: 5.0000 mg | Freq: Once | ORAL | Status: AC | PRN

## 2019-09-12 MED ORDER — PROPOFOL 500 MG/50ML IV EMUL
INTRAVENOUS | Status: AC
  Filled 2019-09-12: qty 50

## 2019-09-12 MED ORDER — CEFAZOLIN SODIUM-DEXTROSE 2-4 GM/100ML-% IV SOLN
2.0000 g | Freq: Three times a day (TID) | INTRAVENOUS | Status: AC
  Administered 2019-09-12 – 2019-09-13 (×2): 2 g via INTRAVENOUS
  Filled 2019-09-12 (×2): qty 100

## 2019-09-12 MED ORDER — MIDAZOLAM HCL 2 MG/2ML IJ SOLN
INTRAMUSCULAR | Status: DC | PRN
  Administered 2019-09-12: 2 mg via INTRAVENOUS

## 2019-09-12 MED ORDER — OXYCODONE HCL 5 MG PO TABS
10.0000 mg | ORAL_TABLET | ORAL | Status: DC | PRN
  Administered 2019-09-13 (×3): 10 mg via ORAL
  Filled 2019-09-12 (×4): qty 2

## 2019-09-12 MED ORDER — METHOCARBAMOL 1000 MG/10ML IJ SOLN
500.0000 mg | Freq: Four times a day (QID) | INTRAVENOUS | Status: DC | PRN
  Administered 2019-09-12: 500 mg via INTRAVENOUS
  Filled 2019-09-12 (×2): qty 5

## 2019-09-12 MED ORDER — LACTATED RINGERS IV SOLN: INTRAVENOUS | Status: DC

## 2019-09-12 MED ORDER — OXYCODONE HCL 5 MG PO TABS
5.0000 mg | ORAL_TABLET | ORAL | Status: DC | PRN
  Administered 2019-09-13: 5 mg via ORAL

## 2019-09-12 MED ORDER — FAMOTIDINE 20 MG PO TABS: 20.0000 mg | ORAL_TABLET | Freq: Once | ORAL | Status: AC

## 2019-09-12 MED ORDER — OXYCODONE HCL 5 MG PO TABS
ORAL_TABLET | ORAL | Status: AC
  Administered 2019-09-12: 5 mg via ORAL
  Filled 2019-09-12: qty 1

## 2019-09-12 MED ORDER — ONDANSETRON HCL 4 MG PO TABS: 4.0000 mg | ORAL_TABLET | Freq: Four times a day (QID) | ORAL | Status: DC | PRN

## 2019-09-12 MED ORDER — SUGAMMADEX SODIUM 200 MG/2ML IV SOLN
INTRAVENOUS | Status: DC | PRN
  Administered 2019-09-12: 192.4 mg via INTRAVENOUS

## 2019-09-12 MED ORDER — ACETAMINOPHEN 650 MG RE SUPP: 650.0000 mg | RECTAL | Status: DC | PRN

## 2019-09-12 MED ORDER — PROPOFOL 10 MG/ML IV BOLUS
INTRAVENOUS | Status: DC | PRN
  Administered 2019-09-12: 150 mg via INTRAVENOUS

## 2019-09-12 MED ORDER — CEFAZOLIN SODIUM-DEXTROSE 2-4 GM/100ML-% IV SOLN
2.0000 g | INTRAVENOUS | Status: AC
  Administered 2019-09-12: 2 g via INTRAVENOUS

## 2019-09-12 MED ORDER — FENTANYL CITRATE (PF) 100 MCG/2ML IJ SOLN
25.0000 ug | INTRAMUSCULAR | Status: DC | PRN
  Administered 2019-09-12 (×2): 50 ug via INTRAVENOUS

## 2019-09-12 MED ORDER — LIDOCAINE HCL (CARDIAC) PF 100 MG/5ML IV SOSY
PREFILLED_SYRINGE | INTRAVENOUS | Status: DC | PRN
  Administered 2019-09-12: 100 mg via INTRAVENOUS

## 2019-09-12 SURGICAL SUPPLY — 92 items
BAYONET BIPOLAR, 190MM ×1 IMPLANT
BUR NEURO DRILL SOFT 3.0X3.8M (BURR) ×1 IMPLANT
CANISTER SUCT 1200ML W/VALVE (MISCELLANEOUS) ×4 IMPLANT
CAP LOCKING SPINE (Cap) ×4 IMPLANT
CHLORAPREP W/TINT 26 (MISCELLANEOUS) ×8 IMPLANT
CORD BIP STRL DISP 12FT (MISCELLANEOUS) ×2 IMPLANT
CORD LIGHT LATERIAL X LIFT (MISCELLANEOUS) ×1 IMPLANT
COUNTER NEEDLE 20/40 LG (NEEDLE) ×2 IMPLANT
COVER BACK TABLE REUSABLE LG (DRAPES) ×2 IMPLANT
COVER LIGHT HANDLE STERIS (MISCELLANEOUS) ×8 IMPLANT
COVER WAND RF STERILE (DRAPES) ×2 IMPLANT
CRADLE LAMINECT ARM (MISCELLANEOUS) ×4 IMPLANT
CUP MEDICINE 2OZ PLAST GRAD ST (MISCELLANEOUS) ×2 IMPLANT
DERMABOND ADVANCED (GAUZE/BANDAGES/DRESSINGS) ×1
DERMABOND ADVANCED .7 DNX12 (GAUZE/BANDAGES/DRESSINGS) ×2 IMPLANT
DRAPE C-ARM 42X72 X-RAY (DRAPES) ×4 IMPLANT
DRAPE C-ARMOR (DRAPES) ×4 IMPLANT
DRAPE INCISE IOBAN 66X45 STRL (DRAPES) ×4 IMPLANT
DRAPE LAPAROTOMY 100X77 ABD (DRAPES) ×4 IMPLANT
DRAPE MICROSCOPE SPINE 48X150 (DRAPES) ×2 IMPLANT
DRAPE POUCH INSTRU U-SHP 10X18 (DRAPES) ×1 IMPLANT
DRAPE SURG 17X11 SM STRL (DRAPES) ×16 IMPLANT
DRSG OPSITE POSTOP 3X4 (GAUZE/BANDAGES/DRESSINGS) ×1 IMPLANT
DRSG OPSITE POSTOP 4X6 (GAUZE/BANDAGES/DRESSINGS) IMPLANT
DRSG TEGADERM 2-3/8X2-3/4 SM (GAUZE/BANDAGES/DRESSINGS) IMPLANT
DRSG TEGADERM 4X4.75 (GAUZE/BANDAGES/DRESSINGS) IMPLANT
DRSG TEGADERM 6X8 (GAUZE/BANDAGES/DRESSINGS) IMPLANT
DRSG TELFA 3X8 NADH (GAUZE/BANDAGES/DRESSINGS) IMPLANT
DRSG TELFA 4X3 1S NADH ST (GAUZE/BANDAGES/DRESSINGS) IMPLANT
ELECT CAUTERY BLADE TIP 2.5 (TIP) ×4
ELECT EZSTD 165MM 6.5IN (MISCELLANEOUS) ×2
ELECT REM PT RETURN 9FT ADLT (ELECTROSURGICAL) ×4
ELECTRODE CAUTERY BLDE TIP 2.5 (TIP) ×2 IMPLANT
ELECTRODE EZSTD 165MM 6.5IN (MISCELLANEOUS) ×1 IMPLANT
ELECTRODE REM PT RTRN 9FT ADLT (ELECTROSURGICAL) ×2 IMPLANT
FEE INTRAOP MONITOR IMPULS NCS (MISCELLANEOUS) IMPLANT
FORCEPS BIPOLAR BAYONET STR (FORCEP) ×1 IMPLANT
FRAME EYE SHIELD (PROTECTIVE WEAR) ×4 IMPLANT
GLOVE BIOGEL PI IND STRL 7.0 (GLOVE) ×2 IMPLANT
GLOVE BIOGEL PI INDICATOR 7.0 (GLOVE) ×2
GLOVE SURG SYN 7.0 (GLOVE) ×8 IMPLANT
GLOVE SURG SYN 7.0 PF PI (GLOVE) ×4 IMPLANT
GLOVE SURG SYN 8.5  E (GLOVE) ×6
GLOVE SURG SYN 8.5 E (GLOVE) ×6 IMPLANT
GLOVE SURG SYN 8.5 PF PI (GLOVE) ×6 IMPLANT
GOWN SRG XL LVL 3 NONREINFORCE (GOWNS) ×2 IMPLANT
GOWN STRL NON-REIN TWL XL LVL3 (GOWNS) ×2
GOWN STRL REUS W/ TWL XL LVL3 (GOWN DISPOSABLE) ×1 IMPLANT
GOWN STRL REUS W/TWL MED LVL3 (GOWN DISPOSABLE) ×2 IMPLANT
GOWN STRL REUS W/TWL XL LVL3 (GOWN DISPOSABLE) ×1
GRADUATE 1200CC STRL 31836 (MISCELLANEOUS) ×2 IMPLANT
INTRAOP MONITOR FEE IMPULS NCS (MISCELLANEOUS)
INTRAOP MONITOR FEE IMPULSE (MISCELLANEOUS)
K-WIRE 1.6 NITINOL SHARP TIP (WIRE) ×8
KIT DILATOR XLIF 5 (KITS) ×1 IMPLANT
KIT DISP MARS 3V (KITS) ×1 IMPLANT
KIT NDL NVM5 EMG ELECT (KITS) IMPLANT
KIT NEEDLE NVM5 EMG ELECT (KITS) ×2 IMPLANT
KIT SPINAL PRONEVIEW (KITS) ×2 IMPLANT
KIT TURNOVER KIT A (KITS) ×2 IMPLANT
KNIFE BAYONET SHORT DISCETOMY (MISCELLANEOUS) IMPLANT
KWIRE 1.6 NITINOL SHARP TIP (WIRE) IMPLANT
LIGHT CORD ×1 IMPLANT
MARKER SKIN DUAL TIP RULER LAB (MISCELLANEOUS) ×6 IMPLANT
NDL I PASS (NEEDLE) IMPLANT
NDL SAFETY ECLIPSE 18X1.5 (NEEDLE) ×1 IMPLANT
NEEDLE HYPO 18GX1.5 SHARP (NEEDLE) ×1
NEEDLE HYPO 22GX1.5 SAFETY (NEEDLE) ×2 IMPLANT
NEEDLE I PASS (NEEDLE) ×2 IMPLANT
PACK LAMINECTOMY NEURO (CUSTOM PROCEDURE TRAY) ×2 IMPLANT
PAD ARMBOARD 7.5X6 YLW CONV (MISCELLANEOUS) ×2 IMPLANT
PAD DRESSING TELFA 3X8 NADH (GAUZE/BANDAGES/DRESSINGS) IMPLANT
PENCIL ELECTRO HAND CTR (MISCELLANEOUS) ×2 IMPLANT
PUTTY DBX 5CC (Putty) ×1 IMPLANT
ROD SPINE CURVE 5.5X55 (Rod) ×2 IMPLANT
SCREW CANN SPINE 6.5X45 (Screw) ×4 IMPLANT
SCREW CREO MIS 30 TULIP (Screw) ×4 IMPLANT
SPACER RISE-L 18X50 7-14 (Spacer) ×1 IMPLANT
SPOGE SURGIFLO 8M (HEMOSTASIS) ×1
SPONGE GAUZE 2X2 8PLY STRL LF (GAUZE/BANDAGES/DRESSINGS) IMPLANT
SPONGE SURGIFLO 8M (HEMOSTASIS) ×1 IMPLANT
STAPLER SKIN PROX 35W (STAPLE) IMPLANT
SUT DVC VLOC 3-0 CL 6 P-12 (SUTURE) ×3 IMPLANT
SUT ETHILON 3-0 FS-10 30 BLK (SUTURE)
SUT VIC AB 0 CT1 27 (SUTURE) ×1
SUT VIC AB 0 CT1 27XCR 8 STRN (SUTURE) ×1 IMPLANT
SUT VIC AB 2-0 CT1 18 (SUTURE) ×2 IMPLANT
SUTURE EHLN 3-0 FS-10 30 BLK (SUTURE) IMPLANT
SYR 30ML LL (SYRINGE) ×4 IMPLANT
TOWEL OR 17X26 4PK STRL BLUE (TOWEL DISPOSABLE) ×7 IMPLANT
TRAY FOLEY MTR SLVR 16FR STAT (SET/KITS/TRAYS/PACK) ×1 IMPLANT
TUBING CONNECTING 10 (TUBING) ×7 IMPLANT

## 2019-09-12 NOTE — H&P (Signed)
History of Present Illness: 09/12/2019 Melissa [name] presents today for surgical intervention.  She continues to have symptoms as described below.   06/07/2019  Melissa [name] returns to see me. Unfortunately, her husband passed away since last time I saw her. 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  She did try physical therapy for 1 visit, but it worsened her condition. She has continued to try exercises under my guidance. She has failed injections in the past.  Returns today to discuss surgery, as she continues to have significant symptoms.  02/22/2019 Melissa [name] is here today with a chief complaint of low back pain with radiation to the right buttock and down the back of her right leg to her heel. She reports feeling that her legs are going to give out.  She has been having problems since 2018 but they have worsened over the past year. She did try physical therapy in 2018 which helped a little bit, but has not done therapy recently. She has gained about 20 pounds over the past few weeks. She reports sharp and aching and burning pain that goes down her right leg to her anterolateral calf. This is as bad as 10 out of 10. It is made better by leaning over a grocery cart and by resting. Standing and walking make it worse. It is worse through the day. She denies weakness or bowel or bladder dysfunction.  At this point, she is having a lot of trouble going about her day-to-day activities including taking care of her husband, who has dementia. She is trying to get him placed into a home at this point.  Conservative measures:  Physical therapy: tried in 2018 Multimodal medical therapy including regular antiinflammatories: ibuprofen, tramadol, prednisone, flexeril, percocet, meloxicam, tizanidine Injections: has tried epidural steroid injections 02/13/2019: Right S1 transforaminal ESI (relief only for 1-2 days) 01/16/2019: Right L5-S1 transforaminal ESI (flare of pain, no relief)  Past Surgery:  denies  Melissa [name] has no symptoms of cervical myelopathy.  The symptoms are causing a significant impact on the patient's life.   Review of Systems:  A 10 point review of systems is negative, except for the pertinent positives and negatives detailed in the HPI.  Past Medical History: Past Medical History:  Diagnosis Date  . Arthritis  . Depression 2004  . Hypertension  . Spinal stenosis   Past Surgical History: Past Surgical History:  Procedure Laterality Date  . dental surgery 09/2017  removal of bottom teeth   No Known Allergies Current Meds  Medication Sig  . ALPRAZolam (XANAX) 1 MG tablet Take 1 mg by mouth at bedtime.  . DULoxetine (CYMBALTA) 60 MG capsule Take 60 mg by mouth every evening.  Marland Kitchen ibuprofen (ADVIL) 200 MG tablet Take 400 mg by mouth every 6 (six) hours as needed for moderate pain.  . traZODone (DESYREL) 100 MG tablet Take 100 mg by mouth at bedtime.     Social History: Social History   Tobacco Use  . Smoking status: Former Smoker  Types: Cigarettes  Quit date: 2006  Years since quitting: 15.1  . Smokeless tobacco: Never Used  Substance Use Topics  . Alcohol use: No  . Drug use: No   Family Medical History: Family History  Problem Relation Age of Onset  . Dementia Mother  . Stroke Mother  . High blood pressure (Hypertension) Mother  . Anxiety Mother  . Depression Mother  . Kidney failure Father  . Arthritis Father  . Heart failure Father  .  High blood pressure (Hypertension) Brother  . Cancer Brother  . High blood pressure (Hypertension) Brother  . GI problems Brother   Physical Examination:  Vitals:   09/12/19 1227  BP: (!) 146/78  Pulse: 76  Resp: 18  Temp: (!) 97.3 F (36.3 C)  SpO2: 100%     General: Patient is well developed, well nourished, calm, collected, and in no apparent distress. Attention to examination is appropriate.  Psychiatric: Patient is non-anxious.  Head: Pupils equal, round, and reactive to  light.  ENT: Oral mucosa appears well hydrated.  Neck: Supple. Full range of motion.  Respiratory: Patient is breathing without any difficulty.  Extremities: No edema.  Vascular: Palpable dorsal pedal pulses.  Skin: On exposed skin, there are no abnormal skin lesions.  Heart sounds normal no MRG. Chest Clear to Auscultation Bilaterally.   NEUROLOGICAL:   Awake, alert, oriented to person, place, and time. Speech is clear and fluent. Fund of knowledge is appropriate.   Cranial Nerves: Pupils equal round and reactive to light. Facial tone is symmetric. Facial sensation is symmetric. Shoulder shrug is symmetric. Tongue protrusion is midline. There is no pronator drift.  ROM of spine: diminished extension. [name]: Side Biceps Triceps Deltoid Interossei Grip Wrist Ext. Wrist Flex.  R 5 5 5 5 5 5 5   L 5 5 5 5 5 5 5    Side Iliopsoas Quads Hamstring PF DF EHL  R 5 5 5 5 5 5   L 5 5 5 5 5 5    Reflexes are 2+ and symmetric at the biceps, triceps, brachioradialis, patella and achilles. Hoffman's is absent. Clonus is not present. Toes are down-going.  Bilateral upper and lower extremity sensation is intact to light touch.  Gait is antalgic.No evidence of dysmetria noted.  Medical Decision Making  Imaging: MRI L spine 1955-08-07 IMPRESSION: 1. Severe right, moderate left L4-5 neural foraminal stenosis. 2. Moderate left L5-S1 neural foraminal stenosis. 3. Grade 1 L4-5 anterolisthesis due to moderate facet arthrosis.  Electronically Signed By: Ulyses Jarred M.D. On: 01/07/2019 03:55  Other Result Information   Flexion-extension radiographs from June 07, 2019 show 6 mm of anterolisthesis of L4 on L5 in extension and 10 mm of anterolisthesis on flexion.  I have personally reviewed the images and agree with the above interpretation.  Assessment and Plan: Melissa [name] is a pleasant 64 y.o. female with L4-5 anterolisthesis. It measures 8.5 mm on standing x-rays from 2018 and  5 mm on her supine MRI scan. This extends to 10 mm on flexion x-rays. This causes severe right and moderate left neuroforaminal stenosis at L4-5.  She tried physical therapy, but it worsened her condition. She has tried injections, but that did not help. She has tried medical therapy.  I have recommended L4-5 lateral interbody fusion with percutaneous fixation.  We will proceed today,  Meade Maw MD, [facility] Department of Neurosurgery

## 2019-09-12 NOTE — Op Note (Signed)
Indications: Ms. Cappella is a 64 yo female who presented with anterolisthesis M43.10.  She failed conservative management and elected for surgical intervention.  Findings: Correction of anterolisthesis  Preoperative Diagnosis: anterolisthesis M43.10 Postoperative Diagnosis: same   EBL: 25 ml IVF: 1400 ml Drains: none Disposition: Extubated and Stable to PACU Complications: none  A foley catheter was placed.   Preoperative Note:   Risks of surgery discussed include: infection, bleeding, stroke, coma, death, paralysis, CSF leak, nerve/spinal cord injury, numbness, tingling, weakness, complex regional pain syndrome, recurrent stenosis and/or disc herniation, vascular injury, development of instability, neck/back pain, need for further surgery, persistent symptoms, development of deformity, and the risks of anesthesia. The patient understood these risks and agreed to proceed.  NAME OF ANTERIOR PROCEDURE:               1. Anterior lumbar interbody fusion via a left lateral retroperitoneal approach at L4/5 2. Placement of a Lordotic Expandable Globus Rise-L 7-96mm cage  NAME OF POSTERIOR PROCEDURE: 1. Posterior instrumentation using Globus Creo Instrumentation 2. Posterolateral fusion, L4-5      PROCEDURE:  Patient was brought to the operating room, intubated, turned to the lateral position.  All pressure points were checked and double-checked.  The patient was prepped and draped in the standard fashion. Prior to prepping, fluoroscopy was brought in and the patient was positioned with a large bump under the contralateral side between the iliac crest and rib cage, allowing the area between the iliac crest and the lateral aspect of the rib cage to open and increase the ability to reach inferiorly, to facilitate entry into the disc space.  The incision was marked upon the skin both the location of the disc space as well as the superior most aspect of the iliac crest.  Based on the  identification of the disc space an incision was prepared, marked upon the skin and eventually was used for our lateral incision.  The fluoroscopy was turned into a cross table A/P image in order to confirm that the patient's spine remained in a perpendicular trajectory to the floor without rotation.  Once confirming that all the pressure points were checked and double-checked and the patient remained in sturdy position strapped down in this slightly jack-knifed lateral position, the patient was prepped and draped in standard fashion.  The skin was injected with local anesthetic, then incised until the abdominal wall fascia was noted.  I bluntly dissected posteriorly until we were able to identify the posterior musculature near petit's triangle.  At this point, using primarily blunt dissection with our finger aided with a metzenbaum scissor, were able to enter the retroperitoneal cavity.  The retroperitoneal potential space was opened further until palpating out the psoas muscle, the medial aspect of the iliac crest, the medial aspect of the last rib and continued to define the retroperitoneal space with blunt dissection in order to facilitate safe placement of our dilators.    While protecting by dissecting directly onto a finger in the retroperitoneum, the retroperitoneal space was entered safely from the lateral incision and the initial dilator placed onto the muscle belly of the psoas.  While directly stimulating the dilator and after radiographically confirming our location relative to the disc space, I placed the dilator through the psoas.  The dilators were stimulated to ensure remaining safely away from any of the lumbar plexus nerves; the dilators were repositioned until no pathologic stimulation was appreciated.  Once I had confirmed the location of our initial dilator radiographically, a  K-wire secured the dilator into the L4/5 disc space and confirmed position under A/P and lateral fluoroscopy.  At  this point, I dilated up with direct stimulation to confirm lack of pathologic stimulation.  Once all the dilators were in position, I placed in the retractor and secured it onto the table, locked into position and confirmed under A/P and lateral fluoroscopy to confirm our approach angle to the disc space as well as location relative to the disc space.  I then placed the muscle stimulator in through the working channel down to the vertebral body, stimulating the entire lateral surface of the vertebral body and any of the visualized psoas muscle that was adjacent to the retractor, confirming again the safe passage to the psoas before we began performing the discectomy.  At this point, we began our discectomy at L4/5.  The disc was incised laterally throughout the extent of our exposure. Using a combination of pituitary rongeurs, Kerrison rongeurs, rasps, curettes of various sorts, we were able to begin to clean out the disc space.  Once we had cleaned out the majority of the disc space, we then cut the lateral annulus with a cob, breaking the lateral annual attachments on the contralateral side by subtly working the cob through the annulus while using flouroscopy.  Care was taken not to extend further than required after cutting the annular attachments.  After this had been performed, we prepared the endplates for placement of our graft, sized a graft to the disc space by serially dilating up in trial sizes until we confirmed that our graft would be well positioned, allowing distraction while maintaining good grip.  This was confirmed under A/P and lateral fluoroscopy in order to ensure its placement as an eventual trial for placement of our final graft.  We irrigated with bacteriostatic saline.  Once confirmed placement, the Globus Rise-L implant filled with allograft was impacted into position at L4/5 and expanded.   Through a combination of intradiscal distraction and anterior releasing, we were able to  correct the anterior deformity during disc preparation and placement of the graft.          At this point, final radiographs were performed, and we began closure.  The wound was closed using 0 Vicryl interrupted suture in the fascia and 2-0 Vicryl inverted suture were placed in the subcutaneous tissue and dermis. 3-0 monocryl was used for final closure. Dermabond was used to close the skin.    After closing the anterior part in layers, the patient was repositioned into prone position.  All pressure points were checked and double-checked and we brought in fluoroscopy to confirm our approach angles for putting in percutaneous pedicle screws.  The pedicles were marked using true AP flouroscopy, adjusting the angle at each level.  We then prepped and draped the patient in the standard fashion.  At this point, incisions were made for placing percutaneous pedicle screw instrumentation at L4-5.  Starting at L4, a Jamsheedi needle was used to cannulate the pedicle bilaterally using AP flouroscopy. Direct stimulation was used on the needle without any low (<15 mAmp) stimulation thresholds. After cannulation of the pedicle to 30 mm, a K-wire was placed through the [place] approximately and secured.   Using a similar technique, the pedicles at L4-L5 were cannulated and K wires secured. The K wires were then checked using lateral flouroscopy to ensure placement into the vertebral bodies. After confirming placement of K wires, cannulated pedicle screws were introduced over the K wires at each  level.  After advancing each screw into the vertebral body approximately 25-30 mm, the K wire was removed.At each level, 6.5x22mm Globus Creo pedicle screws were placed under lateral flouroscopy. Once the screws were placed, the screw extensions were then linked, a path was formed for the rod and a rod was utilized to connect the screws.  We then compressed, torqued / counter-torqued and removed the screw assembly. Once performed  on each side, confirmatory AP and lateral x-rays were taken and the case was completed.   Again we confirmed radiographically and began our closure.  The wound was closed using 0 Vicryl interrupted suture in the fascia, 2-0 Vicryl inverted suture were placed in the subcutaneous tissue and dermis. 3-0 monocryl was used for final closure. Dermabond was used to close the skin.    Needle, lap and all counts were correct at the end of the case.      Lonell Face NP assisted in the entire procedure.  Meade Maw MD Neurosurgery

## 2019-09-12 NOTE — Plan of Care (Signed)
  Problem: Health Behavior/Discharge Planning: Goal: Ability to manage health-related needs will improve Outcome: Progressing   Problem: Clinical Measurements: Goal: Ability to maintain clinical measurements within normal limits will improve Outcome: Progressing Goal: Will remain free from infection Outcome: Progressing Goal: Diagnostic test results will improve Outcome: Progressing Goal: Respiratory complications will improve Outcome: Progressing Goal: Cardiovascular complication will be avoided Outcome: Progressing   Problem: Activity: Goal: Risk for activity intolerance will decrease Outcome: Progressing   Problem: Nutrition: Goal: Adequate nutrition will be maintained Outcome: Progressing   Problem: Coping: Goal: Level of anxiety will decrease Outcome: Progressing   Problem: Elimination: Goal: Will not experience complications related to bowel motility Outcome: Progressing Goal: Will not experience complications related to urinary retention Outcome: Progressing   Problem: Pain Managment: Goal: General experience of comfort will improve Outcome: Progressing   Problem: Safety: Goal: Ability to remain free from injury will improve Outcome: Progressing   Problem: Skin Integrity: Goal: Risk for impaired skin integrity will decrease Outcome: Progressing   Problem: Health Behavior/Discharge Planning: Goal: Ability to manage health-related needs will improve 09/12/2019 2210 by Annitta Needs, RN Outcome: Progressing 09/12/2019 2210 by Annitta Needs, RN Outcome: Progressing   Problem: Clinical Measurements: Goal: Ability to maintain clinical measurements within normal limits will improve 09/12/2019 2210 by Annitta Needs, RN Outcome: Progressing 09/12/2019 2210 by Annitta Needs, RN Outcome: Progressing Goal: Will remain free from infection 09/12/2019 2210 by Annitta Needs, RN Outcome: Progressing 09/12/2019 2210 by Annitta Needs, RN Outcome:  Progressing Goal: Diagnostic test results will improve 09/12/2019 2210 by Annitta Needs, RN Outcome: Progressing 09/12/2019 2210 by Annitta Needs, RN Outcome: Progressing Goal: Respiratory complications will improve 09/12/2019 2210 by Annitta Needs, RN Outcome: Progressing 09/12/2019 2210 by Annitta Needs, RN Outcome: Progressing Goal: Cardiovascular complication will be avoided 09/12/2019 2210 by Annitta Needs, RN Outcome: Progressing 09/12/2019 2210 by Annitta Needs, RN Outcome: Progressing   Problem: Activity: Goal: Risk for activity intolerance will decrease 09/12/2019 2210 by Annitta Needs, RN Outcome: Progressing 09/12/2019 2210 by Annitta Needs, RN Outcome: Progressing   Problem: Nutrition: Goal: Adequate nutrition will be maintained 09/12/2019 2210 by Annitta Needs, RN Outcome: Progressing 09/12/2019 2210 by Annitta Needs, RN Outcome: Progressing   Problem: Coping: Goal: Level of anxiety will decrease 09/12/2019 2210 by Annitta Needs, RN Outcome: Progressing 09/12/2019 2210 by Annitta Needs, RN Outcome: Progressing   Problem: Elimination: Goal: Will not experience complications related to bowel motility 09/12/2019 2210 by Annitta Needs, RN Outcome: Progressing 09/12/2019 2210 by Annitta Needs, RN Outcome: Progressing Goal: Will not experience complications related to urinary retention 09/12/2019 2210 by Annitta Needs, RN Outcome: Progressing 09/12/2019 2210 by Annitta Needs, RN Outcome: Progressing   Problem: Pain Managment: Goal: General experience of comfort will improve 09/12/2019 2210 by Annitta Needs, RN Outcome: Progressing 09/12/2019 2210 by Annitta Needs, RN Outcome: Progressing   Problem: Safety: Goal: Ability to remain free from injury will improve 09/12/2019 2210 by Annitta Needs, RN Outcome: Progressing 09/12/2019 2210 by Annitta Needs, RN Outcome: Progressing   Problem:  Skin Integrity: Goal: Risk for impaired skin integrity will decrease 09/12/2019 2210 by Annitta Needs, RN Outcome: Progressing 09/12/2019 2210 by Annitta Needs, RN Outcome: Progressing

## 2019-09-12 NOTE — Anesthesia Procedure Notes (Signed)
Procedure Name: Intubation Date/Time: 09/12/2019 3:40 PM Performed by: Nelda Marseille, CRNA Pre-anesthesia Checklist: Patient identified, Patient being monitored, Timeout performed, Emergency Drugs available and Suction available Patient Re-evaluated:Patient Re-evaluated prior to induction Oxygen Delivery Method: Circle system utilized Preoxygenation: Pre-oxygenation with 100% oxygen Induction Type: IV induction Ventilation: Mask ventilation without difficulty Laryngoscope Size: Mac, 3 and McGraph Grade View: Grade I Tube type: Oral Tube size: 7.0 mm Number of attempts: 1 Airway Equipment and Method: Stylet Placement Confirmation: ETT inserted through vocal cords under direct vision,  positive ETCO2 and breath sounds checked- equal and bilateral Secured at: 21 cm Tube secured with: Tape Dental Injury: Teeth and Oropharynx as per pre-operative assessment

## 2019-09-12 NOTE — Transfer of Care (Signed)
Immediate Anesthesia Transfer of Care Note  Patient: Melissa [name]  Procedure(s) Performed: L4-5 LATERAL LUMBAR INTERBODY FUSION (XLIF), L4-5 POSTERIOR SPINAL FUSION (Left )  Patient Location: PACU  Anesthesia Type:General  Level of Consciousness: drowsy and patient cooperative  Airway & Oxygen Therapy: Patient Spontanous Breathing and Patient connected to face mask oxygen  Post-op Assessment: Report given to RN and Post -op Vital signs reviewed and stable  Post vital signs: Reviewed and stable  Last Vitals:  Vitals Value Taken Time  BP 163/110 09/12/19 1845  Temp 35.6 C 09/12/19 1843  Pulse 74 09/12/19 1851  Resp 16 09/12/19 1851  SpO2 100 % 09/12/19 1851  Vitals shown include unvalidated device data.  Last Pain:  Vitals:   09/12/19 1843  TempSrc:   PainSc: 8          Complications: No apparent anesthesia complications

## 2019-09-12 NOTE — Anesthesia Preprocedure Evaluation (Addendum)
Anesthesia Evaluation  Patient identified by MRN, date of birth, ID band Patient awake    Reviewed: Allergy & Precautions, NPO status , Patient's Chart, lab work & pertinent test results  History of Anesthesia Complications Negative for: history of anesthetic complications  Airway Mallampati: I  TM Distance: >3 FB Neck ROM: Full    Dental  (+) Edentulous Lower, Upper Dentures, Dental Advisory Given   Pulmonary neg pulmonary ROS, neg sleep apnea, neg COPD, Patient abstained from smoking.Not current smoker, former smoker,    Pulmonary exam normal breath sounds clear to auscultation       Cardiovascular Exercise Tolerance: Good METShypertension, (-) CAD and (-) Past MI (-) dysrhythmias  Rhythm:Regular Rate:Normal - Systolic murmurs Echo stress test 2017: INTERPRETATION Normal Stress Echocardiogram NORMAL RIGHT VENTRICULAR SYSTOLIC FUNCTION MILD VALVULAR REGURGITATION (See above) NO VALVULAR STENOSIS NOTED Resting EF: >55% (Est.) Post Stress EF:>55% (Est.) Contraction: Normal Contraction: Normal Mitral: MILD MR Tricuspid: MILD TR   Neuro/Psych PSYCHIATRIC DISORDERS Anxiety Depression negative neurological ROS     GI/Hepatic GERD  Controlled,(+)     (-) substance abuse  ,   Endo/Other  neg diabetes  Renal/GU negative Renal ROS     Musculoskeletal  (+) Arthritis ,   Abdominal   Peds  Hematology   Anesthesia Other Findings Past Medical History: No date: Anxiety No date: Arthritis No date: Depression No date: GERD (gastroesophageal reflux disease) No date: HTN (hypertension) No date: Spinal stenosis  Reproductive/Obstetrics                            Anesthesia Physical Anesthesia Plan  ASA: II  Anesthesia Plan: General   Post-op Pain Management:    Induction: Intravenous  PONV Risk Score and Plan: 4 or greater and Ondansetron, Dexamethasone, Propofol infusion, TIVA and  Midazolam  Airway Management Planned: Oral ETT  Additional Equipment: None  Intra-op Plan:   Post-operative Plan: Extubation in OR  Informed Consent: I have reviewed the patients History and Physical, chart, labs and discussed the procedure including the risks, benefits and alternatives for the proposed anesthesia with the patient or authorized representative who has indicated his/her understanding and acceptance.     Dental advisory given  Plan Discussed with: CRNA and Surgeon  Anesthesia Plan Comments: (Discussed risks of anesthesia with patient, including PONV, sore throat, lip/dental damage, eye/face swelling. Rare risks discussed as well, such as cardiorespiratory and neurological sequelae. Patient understands.)        Anesthesia Quick Evaluation

## 2019-09-12 NOTE — Progress Notes (Signed)
Procedure: L4-5 XLIF Procedure date: 09/12/2019 Diagnosis: L4-5 anterolisthesis   History: Melissa [name] is s/p L4-5 XLIF POD0: Tolerated procedure well. Evaluated in post op recovery still disoriented from anesthesia but able to obey commands.   Physical Exam: Vitals:   09/12/19 1918 09/12/19 1925  BP:    Pulse: 88 85  Resp: 16 14  Temp:    SpO2: 100% 99%    General: Drowsy, awakening from anesthesia lying in bed Strength:5/5 throughout lower extremities Sensation: intact and symmetric throughout  Skin: incisions to back clean, dry, intact, L lateral incision c/d/i  Data:  No results for input(s): NA, K, CL, CO2, BUN, CREATININE, LABGLOM, GLUCOSE, CALCIUM in the last 168 hours. No results for input(s): AST, ALT, ALKPHOS in the last 168 hours.  Invalid input(s): TBILI   No results for input(s): WBC, HGB, HCT, PLT in the last 168 hours. No results for input(s): APTT, INR in the last 168 hours.         Assessment/Plan:  Melissa [name] is POD0 s/p L4-5 XLIF. Will continue to monitor. She will be admitted postoperatively to the med-surg unit  - Ambulate as tolerated w/ spine precautions (no bending, lifting, twisting) - Remove foley catheter POD1 at 6am - Pain management as ordered - Postoperative spine films  Lonell Face, NP  Department of Neurosurgery

## 2019-09-12 NOTE — Consult Note (Signed)
Pharmacy Antibiotic Note  Melissa [name] is a 64 y.o. female admitted on 09/12/2019 for planned surgical l procedure.  Pharmacy has been consulted for vancomycin and cefazolin dosing.  Plan: Cefazolin 2g IV 30-min pre-op ordered  Vancomycin 1500mg  (~15mg /kg) IV x 1 dose pre-op ordered    Estimated Creatinine Clearance: 67.6 mL/min (by C-G formula based on SCr of 0.99 mg/dL).    No Known Allergies  Thank you for allowing pharmacy to be a part of this patient's care.  Pernell Dupre, PharmD, BCPS Clinical Pharmacist 09/12/2019 12:20 PM

## 2019-09-13 ENCOUNTER — Inpatient Hospital Stay: Payer: BC Managed Care – PPO

## 2019-09-13 MED ORDER — SENNA 8.6 MG PO TABS: 1.0000 | ORAL_TABLET | Freq: Two times a day (BID) | ORAL | 0 refills | Status: DC

## 2019-09-13 MED ORDER — METHOCARBAMOL 500 MG PO TABS: 500.0000 mg | ORAL_TABLET | Freq: Four times a day (QID) | ORAL | 0 refills | Status: AC | PRN

## 2019-09-13 MED ORDER — KETOROLAC TROMETHAMINE 15 MG/ML IJ SOLN: 15.0000 mg | Freq: Four times a day (QID) | INTRAMUSCULAR | Status: DC | PRN

## 2019-09-13 MED ORDER — ACETAMINOPHEN 325 MG PO TABS: 650.0000 mg | ORAL_TABLET | ORAL | Status: DC | PRN

## 2019-09-13 MED ORDER — OXYCODONE HCL 5 MG PO TABS: 5.0000 mg | ORAL_TABLET | ORAL | 0 refills | Status: AC | PRN

## 2019-09-13 NOTE — Discharge Instructions (Signed)
PLEASE USE CAUTION WHEN TAKING XANAX, OXYCODONE, ROBAXIN, and TRAZADONE. TAKING THESE TOGETHER CAN MAKE YOU SLEEPY and can increase the risk of complications including falls.   While you are taking oxycodone, please avoid taking Xanax and Trazadone at the same time.   Please make sure that you are taking medications to avoid constipation, these include over the counter medications including Sennakot, Colace, and Miralax. If you do not have a BM in the next few days or become nauseous or have abdominal pain, call our office.   Your surgeon has performed an operation on your lumbar spine (low back) to fuse two or more of the vertebrae (bones) together. This procedure is performed to treat a number of different spinal problems, including narrowing of the spinal canal (stenosis), herniated discs, degenerative changes, and injuries.   Many times, patients feel better immediately after surgery and can "overdo it." Even if you feel well, it is important that you follow these activity guidelines. If you do not let your back heal properly from the surgery, you can increase the chance of return of your symptoms and other complications. The following are instructions to help in your recovery once you have been discharged from the hospital.   * Do not take anti-inflammatory medications for 3 months after surgery (naproxen [Aleve], ibuprofen [Advil, Motrin], etc.). These medications can prevent your bones from healing properly.   Activity     No bending, lifting, or twisting ("BLT"). Avoid lifting objects heavier than 10 pounds (gallon milk jug).  Where possible, avoid household activities that involve lifting, bending, reaching, pushing, or pulling such as laundry, vacuuming, grocery shopping, and childcare. Try to arrange for help from friends and family for these activities while your back heals.   Increase physical activity slowly as tolerated.  Taking short walks is encouraged, but avoid strenuous exercise.  Do not jog, run, bicycle, lift weights, or participate in any other exercises unless specifically allowed by your doctor. Avoid prolonged sitting, including car rides.   Talk to your doctor before resuming sexual activity.   You should not drive until cleared by your doctor.   Until released by your doctor, you should not return to work or school.  You should rest at home and let your body heal.   You may shower three days after your surgery.  After showering, lightly dab your incision dry. Do not take a tub bath or go swimming until approved by your doctor at your follow-up appointment.   If your doctor ordered a lumbar brace for you, you should wear it whenever you are out of bed. You may remove it when lying down or sleeping. You should also wear it when riding in a car. Not all back surgeries require a lumbar brace.   If you smoke, we strongly recommend that you quit.  Smoking has been proven to interfere with normal bone healing and will dramatically reduce the success rate of your surgery. Please contact [facility] (800-QUIT-NOW) and use the resources at www.[facility].com for assistance in stopping smoking.   Surgical Incision   If you have a dressing on your incision, remove it 2 days after your surgery. Keep your incision area clean and dry.   If you have staples or stitches on your incision, you should have a follow up scheduled for removal. If you do not have staples or stitches, you will have steri-strips (small pieces of surgical tape) or Dermabond glue. The steri-strips/glue should begin to peel away within about a week (it is  fine if the steri-strips fall off before then). If the strips are still in place one week after your surgery, you may gently remove them.   Diet           You may return to your usual diet. Be sure to stay hydrated.   When to Contact us   Although your surgery and recovery will likely be uneventful, you may have some residual numbness, aches, and pains  in your back and/or legs. This is normal and should improve in the next few weeks.   However, should you experience any of the following, contact us immediately:   - New numbness or weakness   - Pain that is progressively getting worse, and is not relieved by your pain medications or rest   - Bleeding, redness, swelling, pain, or drainage from surgical incision   - Chills or flu-like symptoms   - Fever greater than 101.0 F (38.3 C)   - Problems with bowel or bladder functions   - Difficulty breathing or shortness of breath   - Warmth, tenderness, or swelling in your calf   Contact Information   - During office hours (Monday-Friday 9 am to 5 pm), please call your physician at [phone]  - After hours and weekends, please call [phone] and an answering service will put you in touch with either Dr. Lacinda Axon or Dr. Izora Ribas.   - For a life-threatening emergency, call 911

## 2019-09-13 NOTE — TOC Initial Note (Signed)
Transition of Care [facility]) - Initial/Assessment Note    Patient Details  Name: Melissa [name] MRN: [id number] Date of Birth: [birth year]  Transition [facility]) CM/SW Contact:    Su Hilt, RN Phone Number: 09/13/2019, 11:28 AM  Clinical Narrative:                 Met with the patient to discuss DC plan and needs, she will not need [place] services and has Outpatient already set up, she does need a RW and a 3 in 1 and that will be brought to the room by [employer] before DC, She will be staying with her best friend and she will provide transportation, she is up to date with her PCP, She can afford her medications  Expected Discharge Plan: Home/Self Care Barriers to Discharge: Barriers Resolved   Patient Goals and CMS Choice Patient states their goals for this hospitalization and ongoing recovery are:: go home with her friend      Expected Discharge Plan and Services Expected Discharge Plan: Home/Self Care   Discharge Planning Services: CM Consult   Living arrangements for the past 2 months: Single Family Home                 DME Arranged: 3-N-1, Walker rolling DME Agency: [employer] Date DME Agency Contacted: 09/13/19 Time DME Agency Contacted: [phone] Representative spoke with at DME Agency: Zack Tynan Arranged: NA          Prior Living Arrangements/Services Living arrangements for the past 2 months: [place] Lives with:: Self Patient language and need for interpreter reviewed:: Yes Do you feel safe going back to the place where you live?: Yes      Need for Family Participation in Patient Care: No (Comment) Care giver support system in place?: Yes (comment)   Criminal Activity/Legal Involvement Pertinent to Current Situation/Hospitalization: No - Comment as needed  Activities of Daily Living Home Assistive Devices/Equipment: Dentures (specify type)(upper and lower    she brought upper dentures to hospital) ADL Screening (condition at time of  admission) Patient's cognitive ability adequate to safely complete daily activities?: Yes Is the patient deaf or have difficulty hearing?: No Does the patient have difficulty seeing, even when wearing glasses/contacts?: No Does the patient have difficulty concentrating, remembering, or making decisions?: No Patient able to express need for assistance with ADLs?: Yes Does the patient have difficulty dressing or bathing?: No Independently performs ADLs?: Yes (appropriate for developmental age) Does the patient have difficulty walking or climbing stairs?: Yes Weakness of Legs: Both Weakness of Arms/Hands: None  Permission Sought/Granted   Permission granted to share information with : Yes, Verbal Permission Granted              Emotional Assessment Appearance:: Appears stated age Attitude/Demeanor/Rapport: Engaged Affect (typically observed): Appropriate Orientation: : Oriented to Self, Oriented to Place, Oriented to  Time, Oriented to Situation Alcohol / Substance Use: Not Applicable Psych Involvement: No (comment)  Admission diagnosis:  Lumbar radiculopathy [M54.16] Anterolisthesis [M43.10] Patient Active Problem List   Diagnosis Date Noted  . Lumbar radiculopathy 09/12/2019  . Anterolisthesis 09/12/2019   PCP:  Kirk Ruths, MD Pharmacy:   [place], [place] - [address] [place] [address] Phone: [phone] Fax: [phone]     Social Determinants of Health (SDOH) Interventions    Readmission Risk Interventions No flowsheet data found.

## 2019-09-13 NOTE — Progress Notes (Signed)
Procedure: L4-5 XLIF Procedure date: 09/12/2019 Diagnosis: L4-5 anterolisthesis   History: Melissa [name] is s/p L4-5 XLIF  POD1: Melissa [name] has been seen early this morning, reports that she had a great night, and is eager to start to ambulate.  Her left lower extremity pain is completely gone, and her only complaint is postsurgical lower back pain.  POD0: Tolerated procedure well. Evaluated in post op recovery still disoriented from anesthesia but able to obey commands.   Physical Exam: Vitals:   09/13/19 0755 09/13/19 1147  BP: (!) 107/59 129/72  Pulse: 76 69  Resp: 17 14  Temp: 98.4 F (36.9 C) 98.6 F (37 C)  SpO2: 98% 100%    General: Drowsy, awakening from anesthesia lying in bed Strength:5/5 throughout lower extremities Sensation: intact and symmetric throughout  Skin: incisions to back clean, dry, intact, L lateral incision c/d/i  Data:  No results for input(s): NA, K, CL, CO2, BUN, CREATININE, LABGLOM, GLUCOSE, CALCIUM in the last 168 hours. No results for input(s): AST, ALT, ALKPHOS in the last 168 hours.  Invalid input(s): TBILI   No results for input(s): WBC, HGB, HCT, PLT in the last 168 hours. No results for input(s): APTT, INR in the last 168 hours.         Assessment/Plan:  Melissa [name] is POD1 s/p L4-5 XLIF. Will continue to monitor.   - Ambulate as tolerated w/ spine precautions (no bending, lifting, twisting) - Awaiting trial of void, foley removed at 6 AM today.  Please contact neurosurgery team if patient is unable to urinate. - Pain management as ordered - Postoperative spine films  Lonell Face, NP  Department of Neurosurgery

## 2019-09-13 NOTE — Anesthesia Postprocedure Evaluation (Signed)
Anesthesia Post Note  Patient: Melissa [name]  Procedure(s) Performed: L4-5 LATERAL LUMBAR INTERBODY FUSION (XLIF), L4-5 POSTERIOR SPINAL FUSION (Left )  Patient location during evaluation: PACU Anesthesia Type: General Level of consciousness: awake and alert Pain management: pain level controlled Vital Signs Assessment: post-procedure vital signs reviewed and stable Respiratory status: spontaneous breathing, nonlabored ventilation, respiratory function stable and patient connected to nasal cannula oxygen Cardiovascular status: blood pressure returned to baseline and stable Postop Assessment: no apparent nausea or vomiting Anesthetic complications: no     Last Vitals:  Vitals:   09/13/19 0004 09/13/19 0118  BP: (!) 130/55 (!) 142/81  Pulse: (!) 57 74  Resp: 17 17  Temp: 36.6 C 36.5 C  SpO2: 100% 100%    Last Pain:  Vitals:   09/13/19 0118  TempSrc: Oral  PainSc:                  [name]

## 2019-09-13 NOTE — Progress Notes (Signed)
   09/13/19 1700  Clinical Encounter Type  Visited With Patient and family together  Visit Type Initial  Referral From Nurse  Consult/Referral To Chaplain  Chaplain received OR for AD and visit patient. Patient was getting dress to go home. Chaplain educated patient on the AD document and offered to answered any question the patient may have. Patient thanked Bonney Roussel and Chaplain wished patient well.

## 2019-09-13 NOTE — Progress Notes (Signed)
Patient is stable and ready for discharge. Patient's IVs removed. Patient's belongings packed up by her best friend present at bedside. Writer went over discharge paperwork with patient and she verbalized understanding. Patient's hard script for her prescriptions placed in discharge packet. Patient is going home with RW and BSC. Patient transported via [facility] by nurse tech to her private car with her best friend.

## 2019-09-13 NOTE — Evaluation (Signed)
Occupational Therapy Evaluation Patient Details Name: Melissa [name] MRN: [id number] DOB: 1955-10-18 Today's Date: 09/13/2019    History of Present Illness Pt admitted for lumbar radiculopathy with complaints of LBP with radiation to R buttock and radiating down R leg. History includes HTN, anxiety, GERD, and arthritis.  Pt is now s/p L4/5 fusion on 09/12/19.   Clinical Impression   Pt seen for OT evaluation this date, POD#1 from lumbar fusion. Prior to hospital admission, pt was independent with mobility, ADL, and IADL. No falls in past 12 months. However, has been having increased difficulty with all aspects of mobility and ADL due to back pain. 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 Pt plans to live with a friend following surgery during her recovery and reports friend will be able to provide assist/support as needed for pt. Currently pt is at supervision level with all aspects of ADL mobility and min assist for LB ADL tasks 2/2 back precautions. Pt/friend educated in back precautions, self care skills, bed mobility and functional transfer training, AE/DME for bathing, dressing, and toileting needs, and home/routines modifications and falls prevention strategies to maximize safety and functional independence while minimizing falls risk and maintaining precautions. Pt/friend verbalized understanding of all education/training provided. Handout provided to support recall and carry over of learned precautions/techniques for bed mobility, functional transfers, and self care skills. No additional skilled OT needs at this time. Will discharge in house. Upon hospital discharge, pt safe to discharge home.    Follow Up Recommendations  Follow surgeon's recommendation for DC plan and follow-up therapies    Equipment Recommendations  3 in 1 bedside commode    Recommendations for Other Services       Precautions / Restrictions Precautions Precautions:  Back Precaution Booklet Issued: Yes (comment) Restrictions Weight Bearing Restrictions: No      Mobility Bed Mobility Overal bed mobility: Needs Assistance Bed Mobility: Supine to Sit     Supine to sit: Min guard     General bed mobility comments: deferred, up in recliner at start and end of session  Transfers Overall transfer level: Needs assistance Equipment used: Rolling walker (2 wheeled) Transfers: Sit to/from Stand Sit to Stand: Supervision         General transfer comment: safe technique with cues for upright posture, tends to hunch forward    Balance Overall balance assessment: Mild deficits observed, not formally tested                                         ADL either performed or assessed with clinical judgement   ADL Overall ADL's : Needs assistance/impaired                                       General ADL Comments: Min A for LB ADL 2/2 back precautions, pt reports she will have needed level of assist at home     Vision Baseline Vision/History: Wears glasses Wears Glasses: Reading only Patient Visual Report: No change from baseline Vision Assessment?: No apparent visual deficits     Perception     Praxis      Pertinent Vitals/Pain Pain Assessment: 0-10 Pain Score: 8  Pain Location: low back Pain Descriptors / Indicators: Aching;Operative site guarding Pain Intervention(s): Limited activity within patient's  tolerance;Monitored during session;Repositioned;Premedicated before session     Hand Dominance Right   Extremity/Trunk Assessment Upper Extremity Assessment Upper Extremity Assessment: Overall WFL for tasks assessed(grossly WFL, hx bilat shoulder issues resulting in decreased shoulder flexion - pt compensates with use of AE)   Lower Extremity Assessment Lower Extremity Assessment: Generalized weakness(grossly 4+/5 bilat)       Communication Communication Communication: No difficulties    Cognition Arousal/Alertness: Awake/alert Behavior During Therapy: WFL for tasks assessed/performed Overall Cognitive Status: Within Functional Limits for tasks assessed                                     General Comments       Exercises Exercises: Other exercises Other Exercises Other Exercises: educated and performed exercise hand out including B LE pelvic tilts, resisted knee flexion (with hand), glut sets, AP, hip abd/add, heel slides, and SAQ. ALl ther-ex performed x 5-8 reps with cga. Other Exercises: REviewed back precautions with ability to recite easily 3/3 Other Exercises: Pt/friend instructed in falls prevention, home/routines modifications, AE/DME, back precautions and how to maintain during ADL and mobility, and compression stocking mgt - handout provided to support recall and carryover   Shoulder Instructions      Home Living Family/patient expects to be discharged to:: Private residence Living Arrangements: Non-relatives/Friends Available Help at Discharge: Friend(s);Available 24 hours/day Type of Home: House Home Access: Stairs to enter [employer] of Steps: 3 Entrance Stairs-Rails: Can reach both Home Layout: One level         Bathroom Toilet: Standard     Home Equipment: None;Grab bars - tub/shower;Adaptive equipment Adaptive Equipment: Reacher Additional Comments: will be staying at best friends house      Prior Functioning/Environment Level of Independence: Independent        Comments: 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 Reports mobility has been severely limited due to pain        OT Problem List: Decreased strength;Decreased range of motion;Pain      OT Treatment/Interventions:      OT Goals(Current goals can be found in the care plan section) Acute Rehab OT Goals Patient Stated Goal: to go home OT Goal Formulation: All assessment and education complete, DC therapy  OT Frequency:      Barriers to D/C:            Co-evaluation              AM-PAC OT "6 Clicks" Daily Activity     Outcome Measure Help from another person eating meals?: None Help from another person taking care of personal grooming?: None Help from another person toileting, which includes using toliet, bedpan, or urinal?: A Little Help from another person bathing (including washing, rinsing, drying)?: A Little Help from another person to put on and taking off regular upper body clothing?: None Help from another person to put on and taking off regular lower body clothing?: A Little 6 Click Score: 21   End of Session    Activity Tolerance: Patient tolerated treatment well Patient left: in chair;with call bell/phone within reach;with chair alarm set;with family/visitor present  OT Visit Diagnosis: Other abnormalities of gait and mobility (R26.89)                Time: [id number] OT Time Calculation (min): 35 min Charges:  OT General Charges $OT Visit: 1 Visit OT Evaluation $OT Eval Low Complexity: 1 Low  OT Treatments $Self Care/Home Management : 23-37 mins  Jeni Salles, MPH, MS, OTR/L ascom [phone] 09/13/19, 10:53 AM

## 2019-09-13 NOTE — TOC Progression Note (Signed)
Transition of Care [facility]) - Progression Note    Patient Details  Name: Melissa [name] MRN: [id number] Date of Birth: 04-22-1956  Transition of Care [facility]) CM/SW Contact  Su Hilt, RN Phone Number: 09/13/2019, 8:57 AM  Clinical Narrative:     Reached out to Altha Harm NP for Neurosurgery to inquire if the patient has been set up from Surgeon's office for Home health. Awaiting a responce       Expected Discharge Plan and Services                                                 Social Determinants of Health (SDOH) Interventions    Readmission Risk Interventions No flowsheet data found.

## 2019-09-13 NOTE — Evaluation (Signed)
Physical Therapy Evaluation Patient Details Name: Melissa [name] MRN: [id number] DOB: 1955-10-26 Today's Date: 09/13/2019   History of Present Illness  Pt admitted for lumbar radiculopathy with complaints of LBP with radiation to R buttock and radiating down R leg. History includes HTN, anxiety, GERD, and arthritis.  Pt is now s/p L4/5 fusion on 09/12/19.  Clinical Impression  Pt is a pleasant 64 year old female who was admitted for L4/5 fusion. Educated on back precautions with safe technique. Pt performs bed mobility with cga, transfers with supervision, and ambulation with supervision and RW. Completed stair training safely. Pt still limited by pain, encouraged to continue mobility efforts to decrease stiffness/pain. Ice pack in room. Pt has met all acute rehab goals and is ready to begin PT with next venue. Per discussion with patient, she is already scheduled for OP PT. Discussed with pt to continue mobility efforts with RN staff while in hospital. Will need RW and bedside commode prior to discharge. Pt will be dc in house and does not require follow up. RN aware. Will dc current orders.     Follow Up Recommendations Follow surgeon's recommendation for DC plan and follow-up therapies    Equipment Recommendations  Rolling walker with 5" wheels;3in1 (PT)    Recommendations for Other Services       Precautions / Restrictions Precautions Precautions: Back Precaution Booklet Issued: Yes (comment) Restrictions Weight Bearing Restrictions: No      Mobility  Bed Mobility Overal bed mobility: Needs Assistance Bed Mobility: Supine to Sit     Supine to sit: Min guard     General bed mobility comments: performed log roll with cues and safe technique. Once seated at EOB, upright posture noted  Transfers Overall transfer level: Needs assistance Equipment used: Rolling walker (2 wheeled) Transfers: Sit to/from Stand Sit to Stand: Supervision         General transfer comment:  safe technique with cues for upright posture, tends to hunch forward  Ambulation/Gait Ambulation/Gait assistance: Supervision Gait Distance (Feet): 300 Feet Assistive device: Rolling walker (2 wheeled) Gait Pattern/deviations: Step-through pattern     General Gait Details: ambulated with smooth cadence and good speed. Reciprocal gait pattern and pt able to hold conversation during ambulation. Reports decrease pain with mobility.  Stairs Stairs: Yes Stairs assistance: Min guard Stair Management: Two rails;Step to pattern Number of Stairs: 4 General stair comments: up/down 4 stairs with safe technique.   Wheelchair Mobility    Modified Rankin (Stroke Patients Only)       Balance Overall balance assessment: Mild deficits observed, not formally tested                                           Pertinent Vitals/Pain Pain Assessment: 0-10 Pain Score: 8  Pain Location: low back Pain Descriptors / Indicators: Aching;Operative site guarding Pain Intervention(s): Limited activity within patient's tolerance;Premedicated before session;Repositioned    Home Living Family/patient expects to be discharged to:: Private residence Living Arrangements: Non-relatives/Friends Available Help at Discharge: Friend(s);Available 24 hours/day Type of Home: House Home Access: Stairs to enter Entrance Stairs-Rails: Can reach both Entrance Stairs-Number of Steps: 3 Home Layout: One level Home Equipment: None Additional Comments: will be staying at [facility] house    Prior Function Level of Independence: Independent         Comments: 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 Reports mobility  has been severely limited due to pain     Hand Dominance        Extremity/Trunk Assessment   Upper Extremity Assessment Upper Extremity Assessment: Overall WFL for tasks assessed    Lower Extremity Assessment Lower Extremity Assessment: Generalized weakness(B  LE grossly 4+/5)       Communication   Communication: No difficulties  Cognition Arousal/Alertness: Awake/alert Behavior During Therapy: WFL for tasks assessed/performed Overall Cognitive Status: Within Functional Limits for tasks assessed                                        General Comments      Exercises Other Exercises Other Exercises: educated and performed exercise hand out including B LE pelvic tilts, resisted knee flexion (with hand), glut sets, AP, hip abd/add, heel slides, and SAQ. ALl ther-ex performed x 5-8 reps with cga. Other Exercises: REviewed back precautions with ability to recite easily 3/3   Assessment/Plan    PT Assessment All further PT needs can be met in the next venue of care  PT Problem List Decreased strength;Decreased balance;Decreased mobility;Decreased knowledge of use of DME;Pain       PT Treatment Interventions      PT Goals (Current goals can be found in the Care Plan section)  Acute Rehab PT Goals Patient Stated Goal: to go home PT Goal Formulation: All assessment and education complete, DC therapy Time For Goal Achievement: 09/13/19 Potential to Achieve Goals: Good    Frequency     Barriers to discharge        Co-evaluation               AM-PAC PT "6 Clicks" Mobility  Outcome Measure Help needed turning from your back to your side while in a flat bed without using bedrails?: A Little Help needed moving from lying on your back to sitting on the side of a flat bed without using bedrails?: A Little Help needed moving to and from a bed to a chair (including a wheelchair)?: A Little Help needed standing up from a chair using your arms (e.g., wheelchair or bedside chair)?: None Help needed to walk in hospital room?: None Help needed climbing 3-5 steps with a railing? : None 6 Click Score: 21    End of Session Equipment Utilized During Treatment: Gait belt Activity Tolerance: Patient tolerated treatment  well Patient left: in chair;with chair alarm set;with family/visitor present Nurse Communication: Mobility status PT Visit Diagnosis: Muscle weakness (generalized) (M62.81);Pain;Difficulty in walking, not elsewhere classified (R26.2) Pain - Right/Left: (back) Pain - part of body: (back)    Time: 6222-9798 PT Time Calculation (min) (ACUTE ONLY): 37 min   Charges:   PT Evaluation $PT Eval Low Complexity: 1 Low PT Treatments $Gait Training: 8-22 mins $Therapeutic Exercise: 8-22 mins        Greggory Stallion, PT, DPT [phone]   , 09/13/2019, 10:02 AM

## 2019-09-14 NOTE — Discharge Summary (Signed)
Procedure: L4-5 XLIF Procedure date: 09/12/2019 Diagnosis: L4-5 anterolisthesis   History: Melissa [name] is s/p L4-5 XLIF  POD1: Ms. Melissa [name] has cleared PT and is eager to discharge home. She has been able to tolerate a p.o. diet, and is passing flatus.  POD0: Tolerated procedure well. Evaluated in post op recovery still disoriented from anesthesia but able to obey commands.   Physical Exam: Vitals:   09/13/19 1147 09/13/19 1549  BP: 129/72 106/64  Pulse: 69 79  Resp: 14 14  Temp: 98.6 F (37 C) 98.4 F (36.9 C)  SpO2: 100% 98%    General: alert, oriented x 3 Strength:5/5 throughout lower extremities Sensation: intact and symmetric throughout  Skin: incisions to back clean, dry, intact, L lateral incision c/d/i  Data:  No results for input(s): NA, K, CL, CO2, BUN, CREATININE, LABGLOM, GLUCOSE, CALCIUM in the last 168 hours. No results for input(s): AST, ALT, ALKPHOS in the last 168 hours.  Invalid input(s): TBILI   No results for input(s): WBC, HGB, HCT, PLT in the last 168 hours. No results for input(s): APTT, INR in the last 168 hours.         Assessment/Plan:  [name] is POD1 s/p L4-5 XLIF.   As she has cleared physical therapy, is voiding, and her pain is well controlled, she is cleared for discharge at this time.  Prescriptions have been given to her, and she has been notified of discharge instructions.  She will return to clinic in 2 weeks.  Lonell Face, NP  Department of Neurosurgery

## 2020-03-03 ENCOUNTER — Other Ambulatory Visit: Payer: Self-pay | Admitting: Neurosurgery

## 2020-03-03 DIAGNOSIS — G8929 Other chronic pain: Secondary | ICD-10-CM

## 2020-03-03 DIAGNOSIS — M5442 Lumbago with sciatica, left side: Secondary | ICD-10-CM

## 2020-03-03 DIAGNOSIS — Z981 Arthrodesis status: Secondary | ICD-10-CM

## 2020-03-18 ENCOUNTER — Other Ambulatory Visit: Payer: Self-pay

## 2020-03-18 ENCOUNTER — Ambulatory Visit
Admission: RE | Admit: 2020-03-18 | Discharge: 2020-03-18 | Disposition: A | Discharge status: Home / Self Care | Payer: BC Managed Care – PPO | Source: Ambulatory Visit | Attending: Neurosurgery | Admitting: Neurosurgery

## 2020-03-18 DIAGNOSIS — Z981 Arthrodesis status: Secondary | ICD-10-CM

## 2020-03-18 DIAGNOSIS — M5442 Lumbago with sciatica, left side: Secondary | ICD-10-CM | POA: Insufficient documentation

## 2020-03-18 DIAGNOSIS — G8929 Other chronic pain: Secondary | ICD-10-CM | POA: Diagnosis present

## 2021-02-04 ENCOUNTER — Other Ambulatory Visit: Payer: Self-pay | Admitting: Internal Medicine

## 2021-02-04 DIAGNOSIS — Z1231 Encounter for screening mammogram for malignant neoplasm of breast: Secondary | ICD-10-CM

## 2021-03-06 ENCOUNTER — Other Ambulatory Visit: Payer: Self-pay

## 2021-03-06 ENCOUNTER — Emergency Department
Admission: EM | Admit: 2021-03-06 | Discharge: 2021-03-06 | Disposition: A | Discharge status: Home / Self Care | Payer: BC Managed Care – PPO | Attending: Emergency Medicine | Admitting: Emergency Medicine

## 2021-03-06 ENCOUNTER — Emergency Department: Payer: BC Managed Care – PPO

## 2021-03-06 DIAGNOSIS — R42 Dizziness and giddiness: Secondary | ICD-10-CM | POA: Insufficient documentation

## 2021-03-06 DIAGNOSIS — W19XXXA Unspecified fall, initial encounter: Secondary | ICD-10-CM

## 2021-03-06 DIAGNOSIS — S0011XA Contusion of right eyelid and periocular area, initial encounter: Secondary | ICD-10-CM | POA: Insufficient documentation

## 2021-03-06 DIAGNOSIS — Z87891 Personal history of nicotine dependence: Secondary | ICD-10-CM | POA: Insufficient documentation

## 2021-03-06 DIAGNOSIS — E876 Hypokalemia: Secondary | ICD-10-CM | POA: Diagnosis not present

## 2021-03-06 DIAGNOSIS — W010XXA Fall on same level from slipping, tripping and stumbling without subsequent striking against object, initial encounter: Secondary | ICD-10-CM | POA: Insufficient documentation

## 2021-03-06 DIAGNOSIS — I10 Essential (primary) hypertension: Secondary | ICD-10-CM | POA: Insufficient documentation

## 2021-03-06 LAB — CBC
HCT: 42.2 % (ref 36.0–46.0)
Hemoglobin: 14.1 g/dL (ref 12.0–15.0)
MCH: 29.6 pg (ref 26.0–34.0)
MCHC: 33.4 g/dL (ref 30.0–36.0)
MCV: 88.5 fL (ref 80.0–100.0)
Platelets: 208 10*3/uL (ref 150–400)
RBC: 4.77 MIL/uL (ref 3.87–5.11)
RDW: 13.8 % (ref 11.5–15.5)
WBC: 15.5 10*3/uL — ABNORMAL HIGH (ref 4.0–10.5)
nRBC: 0 % (ref 0.0–0.2)

## 2021-03-06 LAB — BASIC METABOLIC PANEL
Anion gap: 10 (ref 5–15)
BUN: 29 mg/dL — ABNORMAL HIGH (ref 8–23)
CO2: 22 mmol/L (ref 22–32)
Calcium: 8.5 mg/dL — ABNORMAL LOW (ref 8.9–10.3)
Chloride: 105 mmol/L (ref 98–111)
Creatinine, Ser: 1.06 mg/dL — ABNORMAL HIGH (ref 0.44–1.00)
GFR, Estimated: 59 mL/min — ABNORMAL LOW (ref >60)
Glucose, Bld: 90 mg/dL (ref 70–99)
Potassium: 3.3 mmol/L — ABNORMAL LOW (ref 3.5–5.1)
Sodium: 137 mmol/L (ref 135–145)

## 2021-03-06 NOTE — ED Provider Notes (Signed)
[facility]-EMERGENCY DEPARTMENT  ____________________________________________  Time seen: Approximately 7:37 PM  I have reviewed the triage vital signs and the nursing notes.   HISTORY  Chief Complaint Fall   Historian Patient     HPI Melissa [name] is a 65 y.o. female presents to the emergency department after a mechanical fall that happened 2 days ago.  Patient had bruising around right eye and was concerned that she might have had a concussion and she has had some mild dizziness.  She denies current headache, chest pain, chest tightness or abdominal pain.  She has been able to ambulate easily.   Past Medical History:  Diagnosis Date   Anxiety    Arthritis    Depression    GERD (gastroesophageal reflux disease)    HTN (hypertension)    Spinal stenosis      Immunizations up to date:  Yes.     Past Medical History:  Diagnosis Date   Anxiety    Arthritis    Depression    GERD (gastroesophageal reflux disease)    HTN (hypertension)    Spinal stenosis     Patient Active Problem List   Diagnosis Date Noted   Lumbar radiculopathy 09/12/2019   Anterolisthesis 09/12/2019    Past Surgical History:  Procedure Laterality Date   NO PAST SURGERIES      Prior to Admission medications   Medication Sig Start Date End Date Taking? Authorizing Provider  acetaminophen (TYLENOL) 325 MG tablet Take 2 tablets (650 mg total) by mouth every 4 (four) hours as needed for mild pain ((score 1 to 3) or temp > 100.5). 09/13/19   Lonell Face, NP  ALPRAZolam Duanne Moron) 1 MG tablet Take 1 mg by mouth at bedtime.    [provider]  DULoxetine (CYMBALTA) 60 MG capsule Take 60 mg by mouth every evening.    [provider]  senna (SENOKOT) 8.6 MG TABS tablet Take 1 tablet (8.6 mg total) by mouth 2 (two) times daily. 09/13/19   Lonell Face, NP  traZODone (DESYREL) 100 MG tablet Take 100 mg by mouth at bedtime.    [provider]    Allergies Patient has no  known allergies.  Family History  Problem Relation Age of Onset   Stroke Mother    Dementia Mother    Congestive Heart Failure Father     Social History Social History   Tobacco Use   Smoking status: Former    Types: Cigarettes    Quit date: 2006    Years since quitting: 16.8   Smokeless tobacco: Never  Vaping Use   Vaping Use: Never used  Substance Use Topics   Alcohol use: No   Drug use: No     Review of Systems  Constitutional: No fever/chills Eyes: Patient has periorbital ecchymosis on the right. ENT: No upper respiratory complaints. Respiratory: no cough. No SOB/ use of accessory muscles to breath Gastrointestinal:   No nausea, no vomiting.  No diarrhea.  No constipation. Musculoskeletal: Negative for musculoskeletal pain. Skin: Negative for rash, abrasions, lacerations, ecchymosis.    ____________________________________________   PHYSICAL EXAM:  VITAL SIGNS: ED Triage Vitals [03/06/21 1559]  Enc Vitals Group     BP (!) 159/89     Pulse Rate 76     Resp 20     Temp 98.3 F (36.8 C)     Temp Source Oral     SpO2 98 %     Weight 235 lb (106.6 kg)     Height  5\' 5"  (1.651 m)     Head Circumference      Peak Flow      Pain Score 5     Pain Loc      Pain Edu?      Excl. in [place]?      Constitutional: Alert and oriented. Well appearing and in no acute distress. Eyes: Conjunctivae are normal. PERRL. EOMI. Head: Atraumatic. ENT:      Ears: Patient has periorbital ecchymosis on the right.      Nose: No congestion/rhinnorhea.      Mouth/Throat: Mucous membranes are moist.  Neck: No stridor.  No cervical spine tenderness to palpation. Cardiovascular: Normal rate, regular rhythm. Normal S1 and S2.  Good peripheral circulation. Respiratory: Normal respiratory effort without tachypnea or retractions. Lungs CTAB. Good air entry to the bases with no decreased or absent breath sounds Gastrointestinal: Bowel sounds x 4 quadrants. Soft and nontender to  palpation. No guarding or rigidity. No distention. Musculoskeletal: Full range of motion to all extremities. No obvious deformities noted Neurologic:  Normal for age. No gross focal neurologic deficits are appreciated.  Skin:  Skin is warm, dry and intact. No rash noted. Psychiatric: Mood and affect are normal for age. Speech and behavior are normal.   ____________________________________________   LABS (all labs ordered are listed, but only abnormal results are displayed)  Labs Reviewed  BASIC METABOLIC PANEL - Abnormal; Notable for the following components:      Result Value   Potassium 3.3 (*)    BUN 29 (*)    Creatinine, Ser 1.06 (*)    Calcium 8.5 (*)    GFR, Estimated 59 (*)    All other components within normal limits  CBC - Abnormal; Notable for the following components:   WBC 15.5 (*)    All other components within normal limits  URINALYSIS, ROUTINE W REFLEX MICROSCOPIC   ____________________________________________  EKG   ____________________________________________  RADIOLOGY Unk Pinto, personally viewed and evaluated these images (plain radiographs) as part of my medical decision making, as well as reviewing the written report by the radiologist.  CT HEAD WO CONTRAST  Result Date: 03/06/2021 CLINICAL DATA:  Fall with loss of consciousness, tripped over a plant. Bruising to right eye. Dizziness. EXAM: CT HEAD WITHOUT CONTRAST TECHNIQUE: Contiguous axial images were obtained from the base of the skull through the vertex without intravenous contrast. COMPARISON:  None. FINDINGS: Brain: No intracranial hemorrhage, mass effect, or midline shift. Brain volume is normal for age. No hydrocephalus. The basilar cisterns are patent. There is moderate periventricular and deep white matter hypodensity, nonspecific but typically chronic small vessel ischemia. No evidence of territorial infarct or acute ischemia. No extra-axial or intracranial fluid collection. Vascular: No  hyperdense vessel or unexpected calcification. Skull: No fracture or focal lesion. Sinuses/Orbits: Assistant concurrent face CT, reported separately. Other: No mastoid effusion. IMPRESSION: 1. No acute intracranial abnormality. No skull fracture. 2. Moderate chronic small vessel ischemia. Electronically Signed   By: Keith Rake M.D.   On: 03/06/2021 17:25   CT CERVICAL SPINE WO CONTRAST  Result Date: 03/06/2021 CLINICAL DATA:  Tripped over a plant leading to fall. EXAM: CT CERVICAL SPINE WITHOUT CONTRAST TECHNIQUE: Multidetector CT imaging of the cervical spine was performed without intravenous contrast. Multiplanar CT image reconstructions were also generated. COMPARISON:  None. FINDINGS: Alignment: No traumatic subluxation. Trace anterolisthesis of C7 on T1, likely facet mediated. Skull base and vertebrae: No acute fracture. Vertebral body heights are maintained. The dens and  skull base are intact. Soft tissues and spinal canal: No prevertebral fluid or swelling. No visible canal hematoma. Disc levels: Multilevel degenerative disc disease and facet hypertrophy. Disc space narrowing and endplate spurring is most prominently affecting C3-C4, C5-C6 and C6-C7. There is mild bony canal and bilateral foraminal narrowing at C3-C4, multifactorial. Upper chest: Nonacute.  No unexpected findings. Other: None. IMPRESSION: 1. No acute fracture or subluxation of the cervical spine. 2. Multilevel degenerative disc disease and facet hypertrophy. Electronically Signed   By: Keith Rake M.D.   On: 03/06/2021 17:33   CT MAXILLOFACIAL WO CONTRAST  Result Date: 03/06/2021 CLINICAL DATA:  Head trauma. Fall after tripping over a planned. Bruising to right eye. Dizziness. EXAM: CT MAXILLOFACIAL WITHOUT CONTRAST TECHNIQUE: Multidetector CT imaging of the maxillofacial structures was performed. Multiplanar CT image reconstructions were also generated. COMPARISON:  None. FINDINGS: Osseous: No acute fracture of the nasal  bone, zygomatic arches, or mandibles. Temporomandibular joints are congruent. The patient is edentulous. The nasal septum is midline. Intact pterygoid plates. Orbits: No acute orbital fracture.  No evidence of globe injury. Sinuses: Diffuse paranasal sinus mucosal thickening with fluid levels in the right and left maxillary sinus. No sinus fracture. No definite hemosinus. Soft tissues: Mild right periorbital soft tissue edema. No large hematoma. Limited intracranial: Assessed on concurrent head CT, reported separately IMPRESSION: 1. Mild right periorbital soft tissue edema. No acute orbital or facial bone fracture. 2. Diffuse paranasal sinus mucosal thickening with fluid levels in the right and left maxillary sinus, suggestive of sinusitis. No sinus fracture. Electronically Signed   By: Keith Rake M.D.   On: 03/06/2021 17:30    ____________________________________________    PROCEDURES  Procedure(s) performed:     Procedures     Medications - No data to display   ____________________________________________   INITIAL IMPRESSION / ASSESSMENT AND PLAN / ED COURSE  Pertinent labs & imaging results that were available during my care of the patient were reviewed by me and considered in my medical decision making (see chart for details).      Assessment and plan Fall 65 year old female presents to the emergency department after she had a mechanical fall.  Patient was hypertensive at triage but vital signs otherwise reassuring.  She was alert, active and nontoxic-appearing.  CTs of the head and face showed no evidence of intracranial bleed, skull fracture or cervical spine fracture.  CBC and CMP were reassuring.  Patient was notified of mild hypokalemia and I did recommend potassium rich foods over the next week.      ____________________________________________  FINAL CLINICAL IMPRESSION(S) / ED DIAGNOSES  Final diagnoses:  Fall, initial encounter      NEW MEDICATIONS  STARTED DURING THIS VISIT:  ED Discharge Orders     None           This chart was dictated using voice recognition software/Dragon. Despite best efforts to proofread, errors can occur which can change the meaning. Any change was purely unintentional.     Lannie Fields, PA-C 03/06/21 1939    Carrie Mew, MD 03/08/21 [phone]

## 2021-03-06 NOTE — ED Triage Notes (Signed)
Pt to ED for fall on Wednesday. +LOC. Bruising noted to right eye. Pt reports dizziness that started this afternoon with nausea.  Fall caused from tripping over plant.  No blood thinner use.   C/o right shoulder pain. Hx chronic pain in right shoulder, get cortisone shots

## 2021-03-06 NOTE — ED Notes (Signed)
Patient stable and discharged with all personal belongings and AVS. AVS and discharge instructions reviewed with patient and opportunity for questions provided.   

## 2021-03-06 NOTE — ED Notes (Signed)
Pt arrives today from home after glf mechanical fall 2 days ago. PT worried about concussion after having some dizziness and lightheadedness since the fall. Pt has bruise on r eye. Pt denies blood thinners

## 2021-08-06 ENCOUNTER — Other Ambulatory Visit: Payer: Self-pay | Admitting: Internal Medicine

## 2021-08-06 DIAGNOSIS — Z1231 Encounter for screening mammogram for malignant neoplasm of breast: Secondary | ICD-10-CM

## 2021-09-10 ENCOUNTER — Ambulatory Visit
Admission: RE | Admit: 2021-09-10 | Discharge: 2021-09-10 | Disposition: A | Discharge status: Home / Self Care | Payer: BC Managed Care – PPO | Source: Ambulatory Visit | Attending: Internal Medicine | Admitting: Internal Medicine

## 2021-09-10 DIAGNOSIS — Z1231 Encounter for screening mammogram for malignant neoplasm of breast: Secondary | ICD-10-CM | POA: Insufficient documentation

## 2022-03-11 NOTE — Discharge Instructions (Signed)
  Instructions after Total Knee Replacement    P. Holley Bouche., M.D.     Dept. of [facility]  [address], [place]  [address]  Phone: [phone]   Fax: [phone]    DIET: Drink plenty of non-alcoholic fluids. Resume your normal diet. Include foods high in fiber.  ACTIVITY:  You may use crutches or a walker with weight-bearing as tolerated, unless instructed otherwise. You may be weaned off of the walker or crutches by your Physical Therapist.  Do NOT place pillows under the knee. Anything placed under the knee could limit your ability to straighten the knee.   Continue doing gentle exercises. Exercising will reduce the pain and swelling, increase motion, and prevent muscle weakness.   Please continue to use the TED compression stockings for 6 weeks. You may remove the stockings at night, but should reapply them in the morning. Do not drive or operate any equipment until instructed.  WOUND CARE:  Continue to use the PolarCare or ice packs periodically to reduce pain and swelling. You may bathe or shower after the staples are removed at the first office visit following surgery.  MEDICATIONS: You may resume your regular medications. Please take the pain medication as prescribed on the medication. Do not take pain medication on an empty stomach. You have been given a prescription for a blood thinner (Lovenox or Coumadin). Please take the medication as instructed. (NOTE: After completing a 2 week course of Lovenox, take one Enteric-coated aspirin once a day. This along with elevation will help reduce the possibility of phlebitis in your operated leg.) Do not drive or drink alcoholic beverages when taking pain medications.  CALL THE OFFICE FOR: Temperature above 101 degrees Excessive bleeding or drainage on the dressing. Excessive swelling, coldness, or paleness of the toes. Persistent nausea and vomiting.  FOLLOW-UP:  You  should have an appointment to return to the office in 10-14 days after surgery. Arrangements have been made for continuation of Physical Therapy (either home therapy or outpatient therapy).   ,kc

## 2022-03-15 ENCOUNTER — Encounter
Admission: RE | Admit: 2022-03-15 | Discharge: 2022-03-15 | Disposition: A | Discharge status: Home / Self Care | Payer: BC Managed Care – PPO | Source: Ambulatory Visit | Attending: Orthopedic Surgery | Admitting: Orthopedic Surgery

## 2022-03-15 ENCOUNTER — Other Ambulatory Visit: Payer: Self-pay

## 2022-03-15 VITALS — BP 127/83 | HR 89 | Resp 17 | Ht 65.0 in | Wt 260.6 lb

## 2022-03-15 DIAGNOSIS — M1711 Unilateral primary osteoarthritis, right knee: Secondary | ICD-10-CM

## 2022-03-15 DIAGNOSIS — Z01812 Encounter for preprocedural laboratory examination: Secondary | ICD-10-CM

## 2022-03-15 DIAGNOSIS — Z01818 Encounter for other preprocedural examination: Secondary | ICD-10-CM | POA: Insufficient documentation

## 2022-03-15 HISTORY — DX: Malignant (primary) neoplasm, unspecified: C80.1

## 2022-03-15 HISTORY — DX: Headache, unspecified: R51.9

## 2022-03-15 LAB — TYPE AND SCREEN
ABO/RH(D): O POS
Antibody Screen: NEGATIVE

## 2022-03-15 LAB — URINALYSIS, ROUTINE W REFLEX MICROSCOPIC
Bilirubin Urine: NEGATIVE
Glucose, UA: NEGATIVE mg/dL
Ketones, ur: 5 mg/dL — AB
Leukocytes,Ua: NEGATIVE
Nitrite: NEGATIVE
Protein, ur: 30 mg/dL — AB
RBC / HPF: >50 RBC/hpf — ABNORMAL HIGH (ref 0–5)
Specific Gravity, Urine: 1.038 — ABNORMAL HIGH (ref 1.005–1.030)
pH: 5 (ref 5.0–8.0)

## 2022-03-15 LAB — C-REACTIVE PROTEIN: CRP: 0.7 mg/dL (ref <1.0)

## 2022-03-15 LAB — CBC
HCT: 42.8 % (ref 36.0–46.0)
Hemoglobin: 13.8 g/dL (ref 12.0–15.0)
MCH: 28.9 pg (ref 26.0–34.0)
MCHC: 32.2 g/dL (ref 30.0–36.0)
MCV: 89.7 fL (ref 80.0–100.0)
Platelets: 242 10*3/uL (ref 150–400)
RBC: 4.77 MIL/uL (ref 3.87–5.11)
RDW: 13.9 % (ref 11.5–15.5)
WBC: 18.3 10*3/uL — ABNORMAL HIGH (ref 4.0–10.5)
nRBC: 0 % (ref 0.0–0.2)

## 2022-03-15 LAB — SURGICAL PCR SCREEN
MRSA, PCR: NEGATIVE
Staphylococcus aureus: POSITIVE — AB

## 2022-03-15 LAB — SEDIMENTATION RATE: Sed Rate: 16 mm/hr (ref 0–30)

## 2022-03-15 NOTE — Patient Instructions (Addendum)
Your procedure is scheduled on: 03/24/22 - Wednesday Report to the Registration Desk on the 1st floor of the [facility]. To find out your arrival time, please call [phone] between 1PM - 3PM on: 03/23/22 - Tuesday If your arrival time is 6:00 am, do not arrive prior to that time as the [facility] entrance doors do not open until 6:00 am.  REMEMBER: Instructions that are not followed completely may result in serious medical risk, up to and including death; or upon the discretion of your surgeon and anesthesiologist your surgery may need to be rescheduled.  Do not eat food after midnight the night before surgery.  No gum chewing, lozengers or hard candies.  You may however, drink CLEAR liquids up to 2 hours before you are scheduled to arrive for your surgery. Do not drink anything within 2 hours of your scheduled arrival time.  Clear liquids include: - water  - apple juice without pulp - gatorade (not RED colors) - black coffee or tea (Do NOT add milk or creamers to the coffee or tea) Do NOT drink anything that is not on this list.   In addition, your doctor has ordered for you to drink the provided  Ensure Pre-Surgery Clear Carbohydrate Drink  Drinking this carbohydrate drink up to two hours before surgery helps to reduce insulin resistance and improve patient outcomes. Please complete drinking 2 hours prior to scheduled arrival time.  TAKE THESE MEDICATIONS THE MORNING OF SURGERY WITH A SIP OF WATER: NONE  One week prior to surgery: 03/17/22: Stop Anti-inflammatories (NSAIDS) such as Advil, Aleve, Ibuprofen, Motrin, Naproxen, Naprosyn and Aspirin based products such as Excedrin, Goodys Powder, BC Powder.  Stop ANY OVER THE COUNTER supplements until after surgery.  You may however, continue to take Tylenol if needed for pain up until the day of surgery.  No Alcohol for 24 hours before or after surgery.  No Smoking including e-cigarettes for 24 hours prior to surgery.  No  chewable tobacco products for at least 6 hours prior to surgery.  No nicotine patches on the day of surgery.  Do not use any "recreational" drugs for at least a week prior to your surgery.  Please be advised that the combination of cocaine and anesthesia may have negative outcomes, up to and including death. If you test positive for cocaine, your surgery will be cancelled.  On the morning of surgery brush your teeth with toothpaste and water, you may rinse your mouth with mouthwash if you wish. Do not swallow any toothpaste or mouthwash.  Use CHG Soap or wipes as directed on instruction sheet.  Do not wear jewelry, make-up, hairpins, clips or nail polish.  Do not wear lotions, powders, or perfumes.   Do not shave body from the neck down 48 hours prior to surgery just in case you cut yourself which could leave a site for infection.  Also, freshly shaved skin may become irritated if using the CHG soap.  Contact lenses, hearing aids and dentures may not be worn into surgery.  Do not bring valuables to the hospital. [facility] is not responsible for any missing/lost belongings or valuables.   Notify your doctor if there is any change in your medical condition (cold, fever, infection).  Wear comfortable clothing (specific to your surgery type) to the hospital.  After surgery, you can help prevent lung complications by doing breathing exercises.  Take deep breaths and cough every 1-2 hours. Your doctor may order a device called an Chiropodist  to help you take deep breaths. When coughing or sneezing, hold a pillow firmly against your incision with both hands. This is called "splinting." Doing this helps protect your incision. It also decreases belly discomfort.  If you are being admitted to the hospital overnight, leave your suitcase in the car. After surgery it may be brought to your room.  If you are being discharged the day of surgery, you will not be allowed to drive  home. You will need a responsible adult (18 years or older) to drive you home and stay with you that night.   If you are taking public transportation, you will need to have a responsible adult (18 years or older) with you. Please confirm with your physician that it is acceptable to use public transportation.   Please call the [facility] Dept. at [phone] if you have any questions about these instructions.  Surgery Visitation Policy:  Patients undergoing a surgery or procedure may have two family members or support persons with them as long as the person is not COVID-19 positive or experiencing its symptoms.   Inpatient Visitation:    Visiting hours are 7 a.m. to 8 p.m. Up to four visitors are allowed at one time in a patient room. The visitors may rotate out with other people during the day. One designated support person (adult) may remain overnight.  MASKING: Due to an increase in RSV rates and hospitalizations, starting Wednesday, Nov. 15, in patient care areas in which we serve newborns, infants and children, masks will be required for teammates and visitors.  Children ages 27 and under may not visit. This policy affects the following departments only:  [facility] Postpartum area Mother Baby Unit Newborn nursery/Special care nursery  Other areas: Masks continue to be strongly recommended for [facility] teammates, visitors and patients in all other areas. Visitation is not restricted outside of the units listed above.

## 2022-03-21 NOTE — H&P (Signed)
ORTHOPAEDIC HISTORY & PHYSICAL [name], [place] - 03/16/2022 11:00 AM EST Formatting of this note is different from the original. [place] MEDICINE Chief Complaint:  Chief Complaint Patient presents with Knee Pain H & P RIGHT KNEE  History of Present Illness:  Melissa [name] is a 66 y.o. female that presents to clinic today for her preoperative history and evaluation. Patient presents unaccompanied. The patient is scheduled to undergo a right total knee arthroplasty on 03/24/22 by Dr. Marry Guan. Her pain began many years ago. The pain is located primarily along the lateral and anterior aspect of the knee. She describes her pain as worse with weightbearing. She reports associated swelling with some giving way of the knee. She denies associated numbness or tingling, denies locking.  The patient's symptoms have progressed to the point that they decrease her quality of life. The patient has previously undergone conservative treatment including NSAIDS and injections to the knee without adequate control of her symptoms.  Has history of L4-L5 fusion with Dr Izora Ribas. Denies history of DVT, significant cardiac history.  Past Medical, Surgical, Family, Social History, Allergies, Medications:  Past Medical History: Past Medical History: Diagnosis Date Anxiety Arthritis Depression 2004 GERD (gastroesophageal reflux disease) Hypertension Skin cancer nose Spinal stenosis  Past Surgical History: Past Surgical History: Procedure Laterality Date dental surgery 09/2017 removal of bottom teeth L4-5 lateral lumbar interbody fusion with posterior fixtation 09/12/2019 Dr. Meade Maw at [facility], [place] [facility]ONOS[facility]  Current Medications: Current Outpatient Medications Medication Sig Dispense Refill acetaminophen (TYLENOL) 650 MG ER tablet Take 1,300 mg by mouth every 8 (eight) hours as needed for Pain cyclobenzaprine (FLEXERIL) 10 MG tablet  TAKE 1/2 TO 1 TABLET BY MOUTH AT BEDTIMEAS NEEDED 30 tablet 11 DULoxetine (CYMBALTA) 30 MG DR capsule TAKE 1 CAPSULE BY MOUTH ONCE DAILY 90 capsule 1 fluticasone propionate (FLONASE) 50 mcg/actuation nasal spray USE 2 SPRAYS INTO BOTH NOSTRILS TWICE DAILY (Patient taking differently: Place 2 sprays into both nostrils 2 (two) times daily as needed for Rhinitis or Allergies) 16 g 3 ibuprofen (MOTRIN) 200 MG tablet Take 400 mg by mouth every 8 (eight) hours as needed for Pain traZODone (DESYREL) 100 MG tablet Take 2 tablets (200 mg total) by mouth at bedtime for 30 days 180 tablet 3  No current facility-administered medications for this visit.  Allergies: No Known Allergies  Social History: Social History  Socioeconomic History Marital status: Widowed Number of children: 0 Years of education: 12 Highest education level: High school graduate Occupational History Occupation: Full time- Teacher, early years/pre Tobacco Use Smoking status: Former Packs/day: 1.00 Years: 6.00 Additional pack years: 0.00 Total pack years: 6.00 Types: Cigarettes Quit date: 2006 Years since quitting: 17.8 Smokeless tobacco: Never Vaping Use Vaping Use: Never used Substance and Sexual Activity Alcohol use: No Drug use: No Sexual activity: Defer  Family History: Family History Problem Relation Age of Onset Dementia Mother Stroke Mother High blood pressure (Hypertension) Mother Anxiety Mother Depression Mother Kidney failure Father Arthritis Father Heart failure Father High blood pressure (Hypertension) Brother Cancer Brother High blood pressure (Hypertension) Brother GI problems Brother  Review of Systems:  A 10+ ROS was performed, reviewed, and the pertinent orthopaedic findings are documented in the HPI.  Physical Examination:  BP 124/80 (BP Location: Left upper arm, Patient Position: Sitting, BP Cuff Size: Large Adult)  Ht 165.1 cm ('5\' 5"'$ )  Wt (!) 118 kg (260 lb 3.2 oz)  BMI 43.30  kg/m  Patient is a well-developed, well-nourished female  in no acute distress. Patient has normal mood and affect. Patient is alert and oriented to person, place, and time.  HEENT: Atraumatic, normocephalic. Pupils equal and reactive to light. Extraocular motion intact. Noninjected sclera.  Cardiovascular: Regular rate and rhythm, with no murmurs, rubs, or gallops. Distal pulses palpable. No carotid bruits.  Respiratory: Lungs clear to auscultation bilaterally.  Right Knee: Soft tissue swelling: mild Effusion: minimal Erythema: none Crepitance: mild Tenderness: lateral, anterior Alignment: relative valgus Mediolateral laxity: stable Posterior sag: negative Patellar tracking: Good tracking without evidence of subluxation or tilt. Patellar grind is positive. Atrophy: No significant atrophy. Quadriceps tone was fair to good. Range of motion: 0/5/122 degrees  Able to plantarflex and dorsiflex the right ankle. Able to flex and extend the toes.  Sensation intact over the saphenous, lateral sural cutaneous, superficial fibular, and deep fibular nerve distributions.  Tests Performed/Reviewed: X-rays  Anteroposterior, lateral, and sunrise views of the right knee were obtained. Images reveal severe loss of lateral compartment joint space with significant osteophyte formation. No fractures or osseous abnormalities noted.  I personally ordered and interpreted today's x-rays.  Impression:  ICD-10-CM 1. Primary osteoarthritis of right knee M17.11  Plan:  The patient has end-stage degenerative changes of the right knee. It was explained to the patient that the condition is progressive in nature. Having failed conservative treatment, the patient has elected to proceed with a total joint arthroplasty. The patient will undergo a total joint arthroplasty with Dr. Marry Guan. The risks of surgery, including blood clot and infection, were discussed with the patient. Measures to reduce these risks,  including the use of anticoagulation, perioperative antibiotics, and early ambulation were discussed. The importance of postoperative physical therapy was discussed with the patient. The patient elects to proceed with surgery. The patient is instructed to stop all blood thinners prior to surgery. The patient is instructed to call the hospital the day before surgery to learn of the proper arrival time.  Contact our office with any questions or concerns. Follow up as indicated, or sooner should any new problems arise, if conditions worsen, or if they are otherwise concerned.  [name], [facility] [place], [place] [address] Phone: [phone]  This note was generated in part with voice recognition software and I apologize for any typographical errors that were not detected and corrected.  Electronically signed by [name], PA at 03/16/2022 3:09 PM EST

## 2022-03-24 ENCOUNTER — Observation Stay: Payer: BC Managed Care – PPO

## 2022-03-24 ENCOUNTER — Ambulatory Visit: Payer: BC Managed Care – PPO | Admitting: Anesthesiology

## 2022-03-24 ENCOUNTER — Encounter: Payer: Self-pay | Admitting: Orthopedic Surgery

## 2022-03-24 ENCOUNTER — Encounter
Admission: RE | Disposition: A | Discharge status: Home / Self Care | Payer: Self-pay | Source: Ambulatory Visit | Attending: Orthopedic Surgery

## 2022-03-24 ENCOUNTER — Other Ambulatory Visit: Payer: Self-pay

## 2022-03-24 ENCOUNTER — Ambulatory Visit: Payer: BC Managed Care – PPO | Admitting: Urgent Care

## 2022-03-24 ENCOUNTER — Observation Stay
Admission: RE | Admit: 2022-03-24 | Discharge: 2022-03-25 | Disposition: A | Discharge status: Home / Self Care | Payer: BC Managed Care – PPO | Source: Ambulatory Visit | Attending: Orthopedic Surgery | Admitting: Orthopedic Surgery

## 2022-03-24 DIAGNOSIS — Z87891 Personal history of nicotine dependence: Secondary | ICD-10-CM | POA: Insufficient documentation

## 2022-03-24 DIAGNOSIS — Z96659 Presence of unspecified artificial knee joint: Secondary | ICD-10-CM

## 2022-03-24 DIAGNOSIS — Z96651 Presence of right artificial knee joint: Secondary | ICD-10-CM

## 2022-03-24 DIAGNOSIS — Z85828 Personal history of other malignant neoplasm of skin: Secondary | ICD-10-CM | POA: Insufficient documentation

## 2022-03-24 DIAGNOSIS — M1711 Unilateral primary osteoarthritis, right knee: Principal | ICD-10-CM | POA: Insufficient documentation

## 2022-03-24 DIAGNOSIS — Z79899 Other long term (current) drug therapy: Secondary | ICD-10-CM | POA: Diagnosis not present

## 2022-03-24 DIAGNOSIS — I10 Essential (primary) hypertension: Secondary | ICD-10-CM | POA: Diagnosis not present

## 2022-03-24 DIAGNOSIS — T888XXA Other specified complications of surgical and medical care, not elsewhere classified, initial encounter: Secondary | ICD-10-CM

## 2022-03-24 HISTORY — PX: KNEE ARTHROPLASTY: SHX992

## 2022-03-24 HISTORY — DX: Other specified complications of surgical and medical care, not elsewhere classified, initial encounter: T88.8XXA

## 2022-03-24 SURGERY — ARTHROPLASTY, KNEE, TOTAL, USING IMAGELESS COMPUTER-ASSISTED NAVIGATION
Anesthesia: Spinal | Site: Knee | Laterality: Right

## 2022-03-24 MED ORDER — SENNOSIDES-DOCUSATE SODIUM 8.6-50 MG PO TABS
ORAL_TABLET | ORAL | Status: AC
  Filled 2022-03-24: qty 1

## 2022-03-24 MED ORDER — SODIUM CHLORIDE (PF) 0.9 % IJ SOLN
INTRAMUSCULAR | Status: DC | PRN
  Administered 2022-03-24: 120 mL via INTRAMUSCULAR

## 2022-03-24 MED ORDER — FAMOTIDINE 20 MG PO TABS
ORAL_TABLET | ORAL | Status: AC
  Administered 2022-03-24: 20 mg via ORAL
  Filled 2022-03-24: qty 1

## 2022-03-24 MED ORDER — OXYCODONE HCL 5 MG PO TABS
10.0000 mg | ORAL_TABLET | ORAL | Status: DC | PRN
  Administered 2022-03-25: 10 mg via ORAL

## 2022-03-24 MED ORDER — DEXAMETHASONE SODIUM PHOSPHATE 10 MG/ML IJ SOLN: 8.0000 mg | Freq: Once | INTRAMUSCULAR | Status: AC

## 2022-03-24 MED ORDER — PANTOPRAZOLE SODIUM 40 MG PO TBEC
40.0000 mg | DELAYED_RELEASE_TABLET | Freq: Two times a day (BID) | ORAL | Status: DC
  Administered 2022-03-24: 40 mg via ORAL

## 2022-03-24 MED ORDER — OXYCODONE HCL 5 MG PO TABS: 5.0000 mg | ORAL_TABLET | ORAL | Status: DC | PRN

## 2022-03-24 MED ORDER — DEXTROSE 5 % IV SOLN
INTRAVENOUS | Status: DC | PRN
  Administered 2022-03-24: 3 g via INTRAVENOUS

## 2022-03-24 MED ORDER — TRANEXAMIC ACID-NACL 1000-0.7 MG/100ML-% IV SOLN: 1000.0000 mg | Freq: Once | INTRAVENOUS | Status: AC

## 2022-03-24 MED ORDER — ACETAMINOPHEN 10 MG/ML IV SOLN
INTRAVENOUS | Status: AC
  Administered 2022-03-24: 1000 mg via INTRAVENOUS
  Filled 2022-03-24: qty 100

## 2022-03-24 MED ORDER — HYDROMORPHONE HCL 1 MG/ML IJ SOLN
0.2500 mg | INTRAMUSCULAR | Status: DC | PRN
  Administered 2022-03-24 (×2): 0.5 mg via INTRAVENOUS

## 2022-03-24 MED ORDER — MAGNESIUM HYDROXIDE 400 MG/5ML PO SUSP
30.0000 mL | Freq: Every day | ORAL | Status: DC
  Administered 2022-03-24: 30 mL via ORAL

## 2022-03-24 MED ORDER — TRANEXAMIC ACID-NACL 1000-0.7 MG/100ML-% IV SOLN
1000.0000 mg | INTRAVENOUS | Status: AC
  Administered 2022-03-24: 1000 mg via INTRAVENOUS

## 2022-03-24 MED ORDER — LIDOCAINE HCL (CARDIAC) PF 100 MG/5ML IV SOSY
PREFILLED_SYRINGE | INTRAVENOUS | Status: DC | PRN
  Administered 2022-03-24: 100 mg via INTRAVENOUS

## 2022-03-24 MED ORDER — DULOXETINE HCL 60 MG PO CPEP
60.0000 mg | ORAL_CAPSULE | Freq: Every evening | ORAL | Status: DC
  Administered 2022-03-24: 60 mg via ORAL
  Filled 2022-03-24 (×2): qty 1

## 2022-03-24 MED ORDER — ONDANSETRON HCL 4 MG PO TABS: 4.0000 mg | ORAL_TABLET | Freq: Four times a day (QID) | ORAL | Status: DC | PRN

## 2022-03-24 MED ORDER — CHLORHEXIDINE GLUCONATE 0.12 % MT SOLN: 15.0000 mL | Freq: Once | OROMUCOSAL | Status: AC

## 2022-03-24 MED ORDER — CEFAZOLIN SODIUM-DEXTROSE 2-4 GM/100ML-% IV SOLN
INTRAVENOUS | Status: AC
  Filled 2022-03-24: qty 100

## 2022-03-24 MED ORDER — ONDANSETRON HCL 4 MG/2ML IJ SOLN
INTRAMUSCULAR | Status: AC
  Filled 2022-03-24: qty 2

## 2022-03-24 MED ORDER — MENTHOL 3 MG MT LOZG: 1.0000 | LOZENGE | OROMUCOSAL | Status: DC | PRN

## 2022-03-24 MED ORDER — PHENOL 1.4 % MT LIQD: 1.0000 | OROMUCOSAL | Status: DC | PRN

## 2022-03-24 MED ORDER — CELECOXIB 200 MG PO CAPS: 400.0000 mg | ORAL_CAPSULE | Freq: Once | ORAL | Status: AC

## 2022-03-24 MED ORDER — METOCLOPRAMIDE HCL 10 MG PO TABS
10.0000 mg | ORAL_TABLET | Freq: Three times a day (TID) | ORAL | Status: DC
  Administered 2022-03-24: 10 mg via ORAL

## 2022-03-24 MED ORDER — ROCURONIUM BROMIDE 100 MG/10ML IV SOLN
INTRAVENOUS | Status: DC | PRN
  Administered 2022-03-24: 70 mg via INTRAVENOUS
  Administered 2022-03-24: 30 mg via INTRAVENOUS

## 2022-03-24 MED ORDER — ENOXAPARIN SODIUM 30 MG/0.3ML IJ SOSY: 30.0000 mg | PREFILLED_SYRINGE | Freq: Two times a day (BID) | INTRAMUSCULAR | Status: DC

## 2022-03-24 MED ORDER — SENNOSIDES-DOCUSATE SODIUM 8.6-50 MG PO TABS
1.0000 | ORAL_TABLET | Freq: Two times a day (BID) | ORAL | Status: DC
  Administered 2022-03-24: 1 via ORAL

## 2022-03-24 MED ORDER — ACETAMINOPHEN 10 MG/ML IV SOLN
1000.0000 mg | Freq: Four times a day (QID) | INTRAVENOUS | Status: DC
  Administered 2022-03-25: 1000 mg via INTRAVENOUS

## 2022-03-24 MED ORDER — GABAPENTIN 300 MG PO CAPS
ORAL_CAPSULE | ORAL | Status: AC
  Administered 2022-03-24: 300 mg via ORAL
  Filled 2022-03-24: qty 1

## 2022-03-24 MED ORDER — HYDROMORPHONE HCL 1 MG/ML IJ SOLN: 0.5000 mg | INTRAMUSCULAR | Status: DC | PRN

## 2022-03-24 MED ORDER — MAGNESIUM HYDROXIDE 400 MG/5ML PO SUSP
ORAL | Status: AC
  Filled 2022-03-24: qty 30

## 2022-03-24 MED ORDER — FLEET ENEMA 7-19 GM/118ML RE ENEM: 1.0000 | ENEMA | Freq: Once | RECTAL | Status: DC | PRN

## 2022-03-24 MED ORDER — TRAMADOL HCL 50 MG PO TABS
50.0000 mg | ORAL_TABLET | ORAL | Status: DC | PRN
  Administered 2022-03-24: 50 mg via ORAL

## 2022-03-24 MED ORDER — DIPHENHYDRAMINE HCL 12.5 MG/5ML PO ELIX: 12.5000 mg | ORAL_SOLUTION | ORAL | Status: DC | PRN

## 2022-03-24 MED ORDER — HYDROMORPHONE HCL 1 MG/ML IJ SOLN
INTRAMUSCULAR | Status: AC
  Filled 2022-03-24: qty 1

## 2022-03-24 MED ORDER — SODIUM CHLORIDE 0.9 % IV SOLN: INTRAVENOUS | Status: DC

## 2022-03-24 MED ORDER — ORAL CARE MOUTH RINSE: 15.0000 mL | Freq: Once | OROMUCOSAL | Status: AC

## 2022-03-24 MED ORDER — ACETAMINOPHEN 10 MG/ML IV SOLN
INTRAVENOUS | Status: AC
  Filled 2022-03-24: qty 100

## 2022-03-24 MED ORDER — PROPOFOL 10 MG/ML IV BOLUS
INTRAVENOUS | Status: DC | PRN
  Administered 2022-03-24: 200 mg via INTRAVENOUS

## 2022-03-24 MED ORDER — FAMOTIDINE 20 MG PO TABS: 20.0000 mg | ORAL_TABLET | Freq: Once | ORAL | Status: AC

## 2022-03-24 MED ORDER — CHLORHEXIDINE GLUCONATE 4 % EX LIQD: 60.0000 mL | Freq: Once | CUTANEOUS | Status: DC

## 2022-03-24 MED ORDER — TRANEXAMIC ACID 1000 MG/10ML IV SOLN
INTRAVENOUS | Status: DC | PRN
  Administered 2022-03-24: 1000 mg via INTRAVENOUS

## 2022-03-24 MED ORDER — CELECOXIB 200 MG PO CAPS
ORAL_CAPSULE | ORAL | Status: AC
  Administered 2022-03-24: 200 mg via ORAL
  Filled 2022-03-24: qty 1

## 2022-03-24 MED ORDER — HYDROMORPHONE HCL 1 MG/ML IJ SOLN
INTRAMUSCULAR | Status: DC | PRN
  Administered 2022-03-24 (×2): .5 mg via INTRAVENOUS

## 2022-03-24 MED ORDER — METOCLOPRAMIDE HCL 10 MG PO TABS
ORAL_TABLET | ORAL | Status: AC
  Filled 2022-03-24: qty 1

## 2022-03-24 MED ORDER — CEFAZOLIN SODIUM-DEXTROSE 2-4 GM/100ML-% IV SOLN
2.0000 g | Freq: Four times a day (QID) | INTRAVENOUS | Status: AC
  Administered 2022-03-24 – 2022-03-25 (×2): 2 g via INTRAVENOUS

## 2022-03-24 MED ORDER — OXYCODONE HCL 5 MG/5ML PO SOLN: 5.0000 mg | Freq: Once | ORAL | Status: DC | PRN

## 2022-03-24 MED ORDER — DEXAMETHASONE SODIUM PHOSPHATE 10 MG/ML IJ SOLN
INTRAMUSCULAR | Status: AC
  Administered 2022-03-24: 8 mg via INTRAVENOUS
  Filled 2022-03-24: qty 1

## 2022-03-24 MED ORDER — LABETALOL HCL 5 MG/ML IV SOLN
INTRAVENOUS | Status: DC | PRN
  Administered 2022-03-24 (×3): 5 mg via INTRAVENOUS

## 2022-03-24 MED ORDER — CELECOXIB 200 MG PO CAPS
ORAL_CAPSULE | ORAL | Status: AC
  Administered 2022-03-24: 400 mg via ORAL
  Filled 2022-03-24: qty 2

## 2022-03-24 MED ORDER — FERROUS SULFATE 325 (65 FE) MG PO TABS
325.0000 mg | ORAL_TABLET | Freq: Two times a day (BID) | ORAL | Status: DC
  Administered 2022-03-25: 325 mg via ORAL

## 2022-03-24 MED ORDER — CEFAZOLIN IN SODIUM CHLORIDE 3-0.9 GM/100ML-% IV SOLN
3.0000 g | INTRAVENOUS | Status: DC
  Filled 2022-03-24: qty 100

## 2022-03-24 MED ORDER — FENTANYL CITRATE (PF) 100 MCG/2ML IJ SOLN
INTRAMUSCULAR | Status: AC
  Filled 2022-03-24: qty 2

## 2022-03-24 MED ORDER — SODIUM CHLORIDE 0.9 % IR SOLN
Status: DC | PRN
  Administered 2022-03-24: 3000 mL

## 2022-03-24 MED ORDER — TRAZODONE HCL 100 MG PO TABS
100.0000 mg | ORAL_TABLET | Freq: Every day | ORAL | Status: DC
  Administered 2022-03-24: 100 mg via ORAL
  Filled 2022-03-24: qty 1

## 2022-03-24 MED ORDER — ACETAMINOPHEN 10 MG/ML IV SOLN
INTRAVENOUS | Status: DC | PRN
  Administered 2022-03-24: 1000 mg via INTRAVENOUS

## 2022-03-24 MED ORDER — FENTANYL CITRATE (PF) 100 MCG/2ML IJ SOLN
INTRAMUSCULAR | Status: DC | PRN
  Administered 2022-03-24 (×4): 50 ug via INTRAVENOUS

## 2022-03-24 MED ORDER — LACTATED RINGERS IV SOLN: INTRAVENOUS | Status: DC

## 2022-03-24 MED ORDER — OXYCODONE HCL 5 MG PO TABS: 5.0000 mg | ORAL_TABLET | Freq: Once | ORAL | Status: DC | PRN

## 2022-03-24 MED ORDER — ACETAMINOPHEN 325 MG PO TABS: 325.0000 mg | ORAL_TABLET | Freq: Four times a day (QID) | ORAL | Status: DC | PRN

## 2022-03-24 MED ORDER — CHLORHEXIDINE GLUCONATE 0.12 % MT SOLN
OROMUCOSAL | Status: AC
  Administered 2022-03-24: 15 mL via OROMUCOSAL
  Filled 2022-03-24: qty 15

## 2022-03-24 MED ORDER — BISACODYL 10 MG RE SUPP: 10.0000 mg | Freq: Every day | RECTAL | Status: DC | PRN

## 2022-03-24 MED ORDER — TRAMADOL HCL 50 MG PO TABS
ORAL_TABLET | ORAL | Status: AC
  Filled 2022-03-24: qty 1

## 2022-03-24 MED ORDER — MIDAZOLAM HCL 2 MG/2ML IJ SOLN
INTRAMUSCULAR | Status: AC
  Filled 2022-03-24: qty 2

## 2022-03-24 MED ORDER — PANTOPRAZOLE SODIUM 40 MG PO TBEC
DELAYED_RELEASE_TABLET | ORAL | Status: AC
  Filled 2022-03-24: qty 1

## 2022-03-24 MED ORDER — HYDROMORPHONE HCL 1 MG/ML IJ SOLN
INTRAMUSCULAR | Status: AC
  Administered 2022-03-24: 0.5 mg via INTRAVENOUS
  Filled 2022-03-24: qty 1

## 2022-03-24 MED ORDER — ALUM & MAG HYDROXIDE-SIMETH 200-200-20 MG/5ML PO SUSP: 30.0000 mL | ORAL | Status: DC | PRN

## 2022-03-24 MED ORDER — CELECOXIB 200 MG PO CAPS: 200.0000 mg | ORAL_CAPSULE | Freq: Two times a day (BID) | ORAL | Status: DC

## 2022-03-24 MED ORDER — TRANEXAMIC ACID-NACL 1000-0.7 MG/100ML-% IV SOLN
INTRAVENOUS | Status: AC
  Filled 2022-03-24: qty 100

## 2022-03-24 MED ORDER — ENSURE PRE-SURGERY PO LIQD: 296.0000 mL | Freq: Once | ORAL | Status: DC

## 2022-03-24 MED ORDER — ONDANSETRON HCL 4 MG/2ML IJ SOLN
INTRAMUSCULAR | Status: DC | PRN
  Administered 2022-03-24: 4 mg via INTRAVENOUS

## 2022-03-24 MED ORDER — CYCLOBENZAPRINE HCL 5 MG PO TABS: 5.0000 mg | ORAL_TABLET | Freq: Three times a day (TID) | ORAL | Status: DC | PRN

## 2022-03-24 MED ORDER — GABAPENTIN 300 MG PO CAPS: 300.0000 mg | ORAL_CAPSULE | Freq: Once | ORAL | Status: AC

## 2022-03-24 MED ORDER — MIDAZOLAM HCL 2 MG/2ML IJ SOLN
INTRAMUSCULAR | Status: DC | PRN
  Administered 2022-03-24: 2 mg via INTRAVENOUS

## 2022-03-24 MED ORDER — IRRISEPT - 450ML BOTTLE WITH 0.05% CHG IN STERILE WATER, USP 99.95% OPTIME
TOPICAL | Status: DC | PRN
  Administered 2022-03-24: 450 mL

## 2022-03-24 MED ORDER — PROPOFOL 1000 MG/100ML IV EMUL
INTRAVENOUS | Status: AC
  Filled 2022-03-24: qty 100

## 2022-03-24 MED ORDER — ONDANSETRON HCL 4 MG/2ML IJ SOLN
4.0000 mg | Freq: Four times a day (QID) | INTRAMUSCULAR | Status: DC | PRN
  Administered 2022-03-24: 4 mg via INTRAVENOUS

## 2022-03-24 SURGICAL SUPPLY — 79 items
ATTUNE MED DOME PAT 38 KNEE (Knees) IMPLANT
ATTUNE PS FEM RT SZ 6 CEM KNEE (Femur) IMPLANT
ATTUNE PSRP INSR SZ6 5 KNEE (Insert) IMPLANT
BASE TIBIA ATTUNE KNEE SYS SZ6 (Knees) IMPLANT
BATTERY INSTRU NAVIGATION (MISCELLANEOUS) ×4 IMPLANT
BLADE CLIPPER SURG (BLADE) IMPLANT
BLADE SAW 70X12.5 (BLADE) ×1 IMPLANT
BLADE SAW 90X13X1.19 OSCILLAT (BLADE) ×1 IMPLANT
BLADE SAW 90X25X1.19 OSCILLAT (BLADE) ×1 IMPLANT
CANISTER PREVENA PLUS 150 (CANNISTER) IMPLANT
CANISTER WOUND CARE 500ML ATS (WOUND CARE) IMPLANT
COOLER POLAR GLACIER W/PUMP (MISCELLANEOUS) ×1 IMPLANT
DRAPE 3/4 80X56 (DRAPES) ×1 IMPLANT
DRAPE INCISE IOBAN 66X45 STRL (DRAPES) IMPLANT
DRESSING PEEL AND PLAC PRVNA20 (GAUZE/BANDAGES/DRESSINGS) IMPLANT
DRSG DERMACEA NONADH 3X8 (GAUZE/BANDAGES/DRESSINGS) ×1 IMPLANT
DRSG MEPILEX SACRM 8.7X9.8 (GAUZE/BANDAGES/DRESSINGS) ×1 IMPLANT
DRSG OPSITE POSTOP 4X14 (GAUZE/BANDAGES/DRESSINGS) ×1 IMPLANT
DRSG PEEL AND PLACE PREVENA 20 (GAUZE/BANDAGES/DRESSINGS) ×1
DRSG TEGADERM 4X4.75 (GAUZE/BANDAGES/DRESSINGS) ×1 IMPLANT
DURAPREP 26ML APPLICATOR (WOUND CARE) ×2 IMPLANT
ELECT CAUTERY BLADE 6.4 (BLADE) ×1 IMPLANT
ELECT REM PT RETURN 9FT ADLT (ELECTROSURGICAL) ×1
ELECTRODE REM PT RTRN 9FT ADLT (ELECTROSURGICAL) ×1 IMPLANT
EX-PIN ORTHOLOCK NAV 4X150 (PIN) ×2 IMPLANT
GLOVE BIOGEL M STRL SZ7.5 (GLOVE) ×2 IMPLANT
GLOVE BIOGEL PI IND STRL 7.5 (GLOVE) ×1 IMPLANT
GLOVE PI ORTHO PRO STRL 7.5 (GLOVE) ×2 IMPLANT
GLOVE SRG 8 PF TXTR STRL LF DI (GLOVE) ×1 IMPLANT
GLOVE SURG UNDER POLY LF SZ7.5 (GLOVE) ×1 IMPLANT
GLOVE SURG UNDER POLY LF SZ8 (GLOVE) ×1
GOWN STRL REUS W/ TWL LRG LVL3 (GOWN DISPOSABLE) ×2 IMPLANT
GOWN STRL REUS W/ TWL XL LVL3 (GOWN DISPOSABLE) ×1 IMPLANT
GOWN STRL REUS W/TWL LRG LVL3 (GOWN DISPOSABLE) ×1
GOWN STRL REUS W/TWL XL LVL3 (GOWN DISPOSABLE)
GOWN TOGA ZIPPER T7+ PEEL AWAY (MISCELLANEOUS) IMPLANT
HEMOVAC 400CC 10FR (MISCELLANEOUS) ×1 IMPLANT
HOLDER FOLEY CATH W/STRAP (MISCELLANEOUS) ×1 IMPLANT
HOOD PEEL AWAY T7 (MISCELLANEOUS) ×2 IMPLANT
IV NS IRRIG 3000ML ARTHROMATIC (IV SOLUTION) ×1 IMPLANT
JET LAVAGE IRRISEPT WOUND (IRRIGATION / IRRIGATOR) ×1
KIT DRSG PREVENA PLUS 7DAY 125 (MISCELLANEOUS) IMPLANT
KIT TURNOVER KIT A (KITS) ×1 IMPLANT
KNIFE SCULPS 14X20 (INSTRUMENTS) ×1 IMPLANT
LAVAGE JET IRRISEPT WOUND (IRRIGATION / IRRIGATOR) IMPLANT
MANIFOLD NEPTUNE II (INSTRUMENTS) ×2 IMPLANT
NDL SPNL 20GX3.5 QUINCKE YW (NEEDLE) ×2 IMPLANT
NEEDLE SPNL 20GX3.5 QUINCKE YW (NEEDLE) ×2 IMPLANT
NS IRRIG 500ML POUR BTL (IV SOLUTION) ×1 IMPLANT
PACK TOTAL KNEE (MISCELLANEOUS) ×1 IMPLANT
PAD ABD DERMACEA PRESS 5X9 (GAUZE/BANDAGES/DRESSINGS) ×2 IMPLANT
PAD WRAPON POLAR KNEE (MISCELLANEOUS) ×1 IMPLANT
PAD WRAPON POLOR MULTI XL (MISCELLANEOUS) IMPLANT
PIN DRILL FIX HALF THREAD (BIT) ×2 IMPLANT
PIN FIXATION 1/8DIA X 3INL (PIN) ×1 IMPLANT
PULSAVAC PLUS IRRIG FAN TIP (DISPOSABLE) ×1
SOL PREP PVP 2OZ (MISCELLANEOUS) ×1
SOLUTION IRRIG SURGIPHOR (IV SOLUTION) ×1 IMPLANT
SOLUTION PREP PVP 2OZ (MISCELLANEOUS) ×1 IMPLANT
SPONGE DRAIN TRACH 4X4 STRL 2S (GAUZE/BANDAGES/DRESSINGS) ×1 IMPLANT
STAPLER SKIN PROX 35W (STAPLE) ×1 IMPLANT
STOCKINETTE IMPERV 14X48 (MISCELLANEOUS) IMPLANT
STRAP TIBIA SHORT (MISCELLANEOUS) ×1 IMPLANT
SUCTION FRAZIER HANDLE 10FR (MISCELLANEOUS) ×1
SUCTION TUBE FRAZIER 10FR DISP (MISCELLANEOUS) ×1 IMPLANT
SUT VIC AB 0 CT1 36 (SUTURE) ×2 IMPLANT
SUT VIC AB 1 CT1 36 (SUTURE) ×2 IMPLANT
SUT VIC AB 2-0 CT2 27 (SUTURE) ×1 IMPLANT
SYR 30ML LL (SYRINGE) ×2 IMPLANT
TIBIA ATTUNE KNEE SYS BASE SZ6 (Knees) ×1 IMPLANT
TIP FAN IRRIG PULSAVAC PLUS (DISPOSABLE) ×1 IMPLANT
TOWEL OR 17X26 4PK STRL BLUE (TOWEL DISPOSABLE) IMPLANT
TOWER CARTRIDGE SMART MIX (DISPOSABLE) ×1 IMPLANT
TRAP FLUID SMOKE EVACUATOR (MISCELLANEOUS) ×1 IMPLANT
TRAY FOLEY MTR SLVR 16FR STAT (SET/KITS/TRAYS/PACK) ×1 IMPLANT
WATER STERILE IRR 1000ML POUR (IV SOLUTION) ×1 IMPLANT
WRAP-ON POLOR PAD MULTI XL (MISCELLANEOUS) ×1
WRAPON POLAR PAD KNEE (MISCELLANEOUS)
WRAPON POLOR PAD MULTI XL (MISCELLANEOUS) ×1

## 2022-03-24 NOTE — Anesthesia Procedure Notes (Signed)
Procedure Name: Intubation Date/Time: 03/24/2022 12:08 PM  Performed by: Nelda Marseille, CRNAPre-anesthesia Checklist: Patient identified, Patient being monitored, Timeout performed, Emergency Drugs available and Suction available Patient Re-evaluated:Patient Re-evaluated prior to induction Oxygen Delivery Method: Circle System Utilized Preoxygenation: Pre-oxygenation with 100% oxygen Induction Type: IV induction Ventilation: Mask ventilation without difficulty Laryngoscope Size: Mac, 3 and McGraph Grade View: Grade I Tube type: Oral Tube size: 7.0 mm Number of attempts: 1 Airway Equipment and Method: Stylet and Oral airway Placement Confirmation: ETT inserted through vocal cords under direct vision, positive ETCO2 and breath sounds checked- equal and bilateral Secured at: 21 cm Tube secured with: Tape Dental Injury: Teeth and Oropharynx as per pre-operative assessment  Difficulty Due To: Difficulty was unanticipated

## 2022-03-24 NOTE — Progress Notes (Signed)
PT Cancellation Note  Patient Details Name: Melissa [name] MRN: [id number] DOB: November 25, 1955   Cancelled Treatment:    Reason Eval/Treat Not Completed: Patient not medically ready;Pain limiting ability to participate Spoke with PACU nursing ~1630.  RN reports pt still having a lot of pain coming out of general anesthesia.  States that she doubts she will be able/ready to participate with PT exam anytime soon.  Will hold this date and initiate PT exam tomorrow AM.     Kreg Shropshire, DPT 03/24/2022, 4:31 PM

## 2022-03-24 NOTE — Transfer of Care (Signed)
Immediate Anesthesia Transfer of Care Note  Patient: Melissa [name]  Procedure(s) Performed: COMPUTER ASSISTED TOTAL KNEE ARTHROPLASTY (Right: Knee)  Patient Location: PACU  Anesthesia Type:General  Level of Consciousness: awake, alert , and oriented  Airway & Oxygen Therapy: Patient Spontanous Breathing and Patient connected to face mask oxygen  Post-op Assessment: Report given to RN and Post -op Vital signs reviewed and stable  Post vital signs: Reviewed and stable  Last Vitals:  Vitals Value Taken Time  BP 179/94 03/24/22 1545  Temp 37.1 C 03/24/22 1545  Pulse 84 03/24/22 1552  Resp 12 03/24/22 1552  SpO2 97 % 03/24/22 1552  Vitals shown include unvalidated device data.  Last Pain:  Vitals:   03/24/22 0948  TempSrc: Oral  PainSc: 3          Complications:  Encounter Notable Events  Notable Event Outcome Phase Comment  Difficult to intubate - unexpected  Intraprocedure Filed from anesthesia note documentation.

## 2022-03-24 NOTE — Anesthesia Postprocedure Evaluation (Signed)
Anesthesia Post Note  Patient: Melissa [name]  Procedure(s) Performed: COMPUTER ASSISTED TOTAL KNEE ARTHROPLASTY (Right: Knee)  Patient location during evaluation: PACU Anesthesia Type: Spinal Level of consciousness: awake and alert Pain management: pain level controlled Vital Signs Assessment: post-procedure vital signs reviewed and stable Respiratory status: spontaneous breathing, nonlabored ventilation and respiratory function stable Cardiovascular status: blood pressure returned to baseline and stable Postop Assessment: no apparent nausea or vomiting Anesthetic complications: yes   Encounter Notable Events  Notable Event Outcome Phase Comment  Difficult to intubate - unexpected  Intraprocedure Filed from anesthesia note documentation.     Last Vitals:  Vitals:   03/24/22 1745 03/24/22 1809  BP:  133/75  Pulse: 87 79  Resp: 18 16  Temp:  36.8 C  SpO2: 100% 95%    Last Pain:  Vitals:   03/24/22 2110  TempSrc:   PainSc: 2                  [place]

## 2022-03-24 NOTE — Interval H&P Note (Signed)
History and Physical Interval Note:  03/24/2022 11:22 AM  Melissa [name]  has presented today for surgery, with the diagnosis of PRIMARY OSTEOARTHRITIS OF RIGHT KNEE..  The various methods of treatment have been discussed with the patient and family. After consideration of risks, benefits and other options for treatment, the patient has consented to  Procedure(s): COMPUTER ASSISTED TOTAL KNEE ARTHROPLASTY (Right) as a surgical intervention.  The patient's history has been reviewed, patient examined, no change in status, stable for surgery.  I have reviewed the patient's chart and labs.  Questions were answered to the patient's satisfaction.     [place]

## 2022-03-24 NOTE — Anesthesia Preprocedure Evaluation (Signed)
Anesthesia Evaluation  Patient identified by MRN, date of birth, ID band Patient awake    Reviewed: Allergy & Precautions, NPO status , Patient's Chart, lab work & pertinent test results  History of Anesthesia Complications (+) history of anesthetic complications  Airway Mallampati: II  TM Distance: >3 FB Neck ROM: full    Dental  (+) Upper Dentures, Lower Dentures   Pulmonary neg pulmonary ROS, former smoker   Pulmonary exam normal        Cardiovascular hypertension, negative cardio ROS Normal cardiovascular exam     Neuro/Psych  PSYCHIATRIC DISORDERS Anxiety Depression     Neuromuscular disease    GI/Hepatic Neg liver ROS,GERD  Medicated,,  Endo/Other    Morbid obesity  Renal/GU      Musculoskeletal  (+) Arthritis ,    Abdominal  (+) + obese  Peds  Hematology negative hematology ROS (+)   Anesthesia Other Findings Past Medical History: No date: Anxiety No date: Arthritis No date: Cancer ([place])     Comment:  pre cancerous on nose No date: Depression No date: GERD (gastroesophageal reflux disease) No date: Headache     Comment:  cervical No date: HTN (hypertension)     Comment:  on no medications No date: Spinal stenosis  Past Surgical History: No date: BACK SURGERY     Comment:  lumbar area No date: NO PAST SURGERIES No date: WISDOM TOOTH EXTRACTION     Reproductive/Obstetrics negative OB ROS                              Anesthesia Physical Anesthesia Plan  ASA: 3  Anesthesia Plan: Spinal   Post-op Pain Management: Celebrex PO (pre-op)* and Gabapentin PO (pre-op)*   Induction: Intravenous  PONV Risk Score and Plan: 2 and Propofol infusion and TIVA  Airway Management Planned: Oral ETT  Additional Equipment:   Intra-op Plan:   Post-operative Plan: Extubation in OR  Informed Consent: I have reviewed the patients History and Physical, chart, labs and discussed  the procedure including the risks, benefits and alternatives for the proposed anesthesia with the patient or authorized representative who has indicated his/her understanding and acceptance.     Dental Advisory Given  Plan Discussed with: Anesthesiologist, CRNA and Surgeon  Anesthesia Plan Comments: (Patient consented for risks of anesthesia including but not limited to:  - adverse reactions to medications - damage to eyes, teeth, lips or other oral mucosa - nerve damage due to positioning  - sore throat or hoarseness - Damage to heart, brain, nerves, lungs, other parts of body or loss of life  Patient voiced understanding. Patient declines spinal anesthesia. )         Anesthesia Quick Evaluation

## 2022-03-24 NOTE — Op Note (Signed)
OPERATIVE NOTE  DATE OF SURGERY:  03/24/2022  PATIENT NAME:  Melissa [name]   DOB: 10/30/1955  MRN: [id number]  PRE-OPERATIVE DIAGNOSIS: Degenerative arthrosis of the right knee, primary Morbid obesity (BMI = 43.2)  POST-OPERATIVE DIAGNOSIS:  Same  PROCEDURE:  Right total knee arthroplasty using computer-assisted navigation  SURGEON:  Marciano Sequin. M.D.  ASSISTANT: Cassell Smiles, PA-C (present and scrubbed throughout the case, critical for assistance with exposure, retraction, instrumentation, and closure)  ANESTHESIA: general  ESTIMATED BLOOD LOSS: 50 mL  FLUIDS REPLACED: 1600 mL of crystalloid  TOURNIQUET TIME: 87 minutes  DRAINS: 2 medium Hemovac drains, wound VAC  SOFT TISSUE RELEASES: Anterior cruciate ligament, posterior cruciate ligament, deep medial collateral ligament, patellofemoral ligament  IMPLANTS UTILIZED: DePuy Attune size 6 posterior stabilized femoral component (cemented), size 6 rotating platform tibial component (cemented), 38 mm medialized dome patella (cemented), and a 5 mm stabilized rotating platform polyethylene insert.  INDICATIONS FOR SURGERY: Melissa [name] is a 66 y.o. year old female with a long history of progressive knee pain. X-rays demonstrated severe degenerative changes in tricompartmental fashion. The patient had not seen any significant improvement despite conservative nonsurgical intervention. After discussion of the risks and benefits of surgical intervention, the patient expressed understanding of the risks benefits and agree with plans for total knee arthroplasty.   The risks, benefits, and alternatives were discussed at length including but not limited to the risks of infection, bleeding, nerve injury, stiffness, blood clots, the need for revision surgery, cardiopulmonary complications, among others, and they were willing to proceed.  PROCEDURE IN DETAIL: The patient was brought into the operating room and, after adequate general  anesthesia was achieved, a tourniquet was placed on the patient's upper thigh. The patient's knee and leg were cleaned and prepped with alcohol and DuraPrep and draped in the usual sterile fashion. A "timeout" was performed as per usual protocol. The lower extremity was exsanguinated using an Esmarch, and the tourniquet was inflated to 300 mmHg. An anterior longitudinal incision was made followed by a standard mid vastus approach. The deep fibers of the medial collateral ligament were elevated in a subperiosteal fashion off of the medial flare of the tibia so as to maintain a continuous soft tissue sleeve. The patella was subluxed laterally and the patellofemoral ligament was incised. Inspection of the knee demonstrated severe degenerative changes with full-thickness loss of articular cartilage. Osteophytes were debrided using a rongeur. Anterior and posterior cruciate ligaments were excised. Two 4.0 mm Schanz pins were inserted in the femur and into the tibia for attachment of the array of trackers used for computer-assisted navigation. Hip center was identified using a circumduction technique. Distal landmarks were mapped using the computer. The distal femur and proximal tibia were mapped using the computer. The distal femoral cutting guide was positioned using computer-assisted navigation so as to achieve a 5 distal valgus cut. The femur was sized and it was felt that a size 6 femoral component was appropriate. A size 6 femoral cutting guide was positioned and the anterior cut was performed and verified using the computer. This was followed by completion of the posterior and chamfer cuts. Femoral cutting guide for the central box was then positioned in the center box cut was performed.  Attention was then directed to the proximal tibia. Medial and lateral menisci were excised. The extramedullary tibial cutting guide was positioned using computer-assisted navigation so as to achieve a 0 varus-valgus alignment  and 3 posterior slope. The cut was performed and verified  using the computer. The proximal tibia was sized and it was felt that a size 6 tibial tray was appropriate. Tibial and femoral trials were inserted followed by insertion of a 5 mm polyethylene insert. This allowed for excellent mediolateral soft tissue balancing both in flexion and in full extension. Finally, the patella was cut and prepared so as to accommodate a 38 mm medialized dome patella. A patella trial was placed and the knee was placed through a range of motion with excellent patellar tracking appreciated. The femoral trial was removed after debridement of posterior osteophytes. The central post-hole for the tibial component was reamed followed by insertion of a keel punch. Tibial trials were then removed. Cut surfaces of bone were irrigated with copious amounts of normal saline using pulsatile lavage and then suctioned dry. Polymethylmethacrylate cement with gentamicin was prepared in the usual fashion using a vacuum mixer. Cement was applied to the cut surface of the proximal tibia as well as along the undersurface of a size 6 rotating platform tibial component. Tibial component was positioned and impacted into place. Excess cement was removed using [employer] fast streamer. Cement was then applied to the cut surfaces of the femur as well as along the posterior flanges of the size 6 femoral component. The femoral component was positioned and impacted into place. Excess cement was removed using [employer] fast streamer. A 5 mm polyethylene trial was inserted and the knee was brought into full extension with steady axial compression applied. Finally, cement was applied to the backside of a 38 mm medialized dome patella and the patellar component was positioned and patellar clamp applied. Excess cement was removed using [employer] fast streamer. After adequate curing of the cement, the tourniquet was deflated after a total tourniquet time of 87 minutes. Hemostasis was achieved  using electrocautery. The knee was irrigated with copious amounts of normal saline using pulsatile lavage followed by 450 ml of Surgiphor and then suctioned dry. 20 mL of 1.3% Exparel and 60 mL of 0.25% Marcaine in 40 mL of normal saline was injected along the posterior capsule, medial and lateral gutters, and along the arthrotomy site. A 5 mm stabilized rotating platform polyethylene insert was inserted and the knee was placed through a range of motion with excellent mediolateral soft tissue balancing appreciated and excellent patellar tracking noted. 2 medium drains were placed in the wound bed and brought out through separate stab incisions. The medial parapatellar portion of the incision was reapproximated using interrupted sutures of #1 Vicryl. Subcutaneous tissue was approximated in layers using first #0 Vicryl followed #2-0 Vicryl. The skin was approximated with skin staples. A sterile dressing was applied.  The patient tolerated the procedure well and was transported to the recovery room in stable condition.     P. Holley Bouche., M.D.

## 2022-03-25 ENCOUNTER — Encounter: Payer: Self-pay | Admitting: Orthopedic Surgery

## 2022-03-25 DIAGNOSIS — M1711 Unilateral primary osteoarthritis, right knee: Secondary | ICD-10-CM | POA: Diagnosis not present

## 2022-03-25 MED ORDER — CELECOXIB 200 MG PO CAPS: 200.0000 mg | ORAL_CAPSULE | Freq: Two times a day (BID) | ORAL | 0 refills | Status: DC

## 2022-03-25 MED ORDER — OXYCODONE HCL 5 MG PO TABS
ORAL_TABLET | ORAL | Status: AC
  Filled 2022-03-25: qty 2

## 2022-03-25 MED ORDER — CELECOXIB 200 MG PO CAPS
ORAL_CAPSULE | ORAL | Status: AC
  Administered 2022-03-25: 200 mg via ORAL
  Filled 2022-03-25: qty 1

## 2022-03-25 MED ORDER — ENOXAPARIN SODIUM 30 MG/0.3ML IJ SOSY
PREFILLED_SYRINGE | INTRAMUSCULAR | Status: AC
  Administered 2022-03-25: 30 mg via SUBCUTANEOUS
  Filled 2022-03-25: qty 0.3

## 2022-03-25 MED ORDER — ENOXAPARIN SODIUM 40 MG/0.4ML IJ SOSY: 40.0000 mg | PREFILLED_SYRINGE | INTRAMUSCULAR | 0 refills | Status: DC

## 2022-03-25 MED ORDER — ONDANSETRON HCL 4 MG/2ML IJ SOLN
INTRAMUSCULAR | Status: AC
  Administered 2022-03-25: 4 mg via INTRAVENOUS
  Filled 2022-03-25: qty 2

## 2022-03-25 MED ORDER — MAGNESIUM HYDROXIDE 400 MG/5ML PO SUSP
ORAL | Status: AC
  Administered 2022-03-25: 30 mL via ORAL
  Filled 2022-03-25: qty 30

## 2022-03-25 MED ORDER — METOCLOPRAMIDE HCL 10 MG PO TABS
ORAL_TABLET | ORAL | Status: AC
  Administered 2022-03-25: 10 mg via ORAL
  Filled 2022-03-25: qty 1

## 2022-03-25 MED ORDER — FERROUS SULFATE 325 (65 FE) MG PO TABS
ORAL_TABLET | ORAL | Status: AC
  Filled 2022-03-25: qty 1

## 2022-03-25 MED ORDER — SENNOSIDES-DOCUSATE SODIUM 8.6-50 MG PO TABS
ORAL_TABLET | ORAL | Status: AC
  Administered 2022-03-25: 1 via ORAL
  Filled 2022-03-25: qty 1

## 2022-03-25 MED ORDER — OXYCODONE HCL 5 MG PO TABS: 5.0000 mg | ORAL_TABLET | ORAL | 0 refills | Status: DC | PRN

## 2022-03-25 MED ORDER — PANTOPRAZOLE SODIUM 40 MG PO TBEC
DELAYED_RELEASE_TABLET | ORAL | Status: AC
  Administered 2022-03-25: 40 mg via ORAL
  Filled 2022-03-25: qty 1

## 2022-03-25 MED ORDER — OXYCODONE HCL 5 MG PO TABS
ORAL_TABLET | ORAL | Status: AC
  Administered 2022-03-25: 5 mg via ORAL
  Filled 2022-03-25: qty 1

## 2022-03-25 MED ORDER — CEFAZOLIN SODIUM-DEXTROSE 2-4 GM/100ML-% IV SOLN
INTRAVENOUS | Status: AC
  Filled 2022-03-25: qty 100

## 2022-03-25 MED ORDER — TRAMADOL HCL 50 MG PO TABS: 50.0000 mg | ORAL_TABLET | ORAL | 0 refills | Status: DC | PRN

## 2022-03-25 MED ORDER — ACETAMINOPHEN 10 MG/ML IV SOLN
INTRAVENOUS | Status: AC
  Administered 2022-03-25: 1000 mg via INTRAVENOUS
  Filled 2022-03-25: qty 100

## 2022-03-25 NOTE — Progress Notes (Cosign Needed)
Patient is not able to walk the distance required to go the bathroom, or he/she is unable to safely negotiate stairs required to access the bathroom.  A 3in1 BSC will alleviate this problem  

## 2022-03-25 NOTE — Progress Notes (Signed)
Pt is able to void and get up and walk to the bathroom. Discontinued bladder scan due to patient able to walk and void.

## 2022-03-25 NOTE — Plan of Care (Signed)
Patient ready to go home

## 2022-03-25 NOTE — Progress Notes (Signed)
Nsg Discharge Note  Admit Date:  03/24/2022 Discharge date: 03/25/2022   Mykeria Garman to be D/C'd Home per MD order.  AVS completed.  Patient/caregiver able to verbalize understanding.  Discharge Medication: Allergies as of 03/25/2022   No Known Allergies      Medication List     STOP taking these medications    ibuprofen 200 MG tablet Commonly known as: ADVIL       TAKE these medications    acetaminophen 325 MG tablet Commonly known as: TYLENOL Take 2 tablets (650 mg total) by mouth every 4 (four) hours as needed for mild pain ((score 1 to 3) or temp > 100.5).   celecoxib 200 MG capsule Commonly known as: CELEBREX Take 1 capsule (200 mg total) by mouth 2 (two) times daily.   cyclobenzaprine 5 MG tablet Commonly known as: FLEXERIL Take 5 mg by mouth 3 (three) times daily as needed for muscle spasms.   DULoxetine 60 MG capsule Commonly known as: CYMBALTA Take 60 mg by mouth every evening.   enoxaparin 40 MG/0.4ML injection Commonly known as: LOVENOX Inject 0.4 mLs (40 mg total) into the skin daily for 14 days.   oxyCODONE 5 MG immediate release tablet Commonly known as: Oxy IR/ROXICODONE Take 1 tablet (5 mg total) by mouth every 4 (four) hours as needed for severe pain.   traMADol 50 MG tablet Commonly known as: ULTRAM Take 1 tablet (50 mg total) by mouth every 4 (four) hours as needed for moderate pain.   traZODone 100 MG tablet Commonly known as: DESYREL Take 100 mg by mouth at bedtime.               Durable Medical Equipment  (From admission, onward)           Start     Ordered   03/24/22 1810  DME Walker rolling  Once       Question:  Patient needs a walker to treat with the following condition  Answer:  Total knee replacement status   03/24/22 1809   03/24/22 1810  DME Bedside commode  Once       Comments: Patient is not able to walk the distance required to go the bathroom, or he/she is unable to safely negotiate stairs required to  access the bathroom.  A 3in1 BSC will alleviate this problem  Question:  Patient needs a bedside commode to treat with the following condition  Answer:  Total knee replacement status   03/24/22 1809            Discharge Assessment: Vitals:   03/24/22 2352 03/25/22 1455  BP: 132/74 130/81  Pulse: 78 89  Resp: 16 18  Temp: 98.4 F (36.9 C)   SpO2: 96% 95%   Skin clean, dry and intact without evidence of skin break down, no evidence of skin tears noted. IV catheter discontinued intact. Site without signs and symptoms of complications - no redness or edema noted at insertion site, patient denies c/o pain - only slight tenderness at site.  Dressing with slight pressure applied.  D/c Instructions-Education: Discharge instructions given to patient/family with verbalized understanding. D/c education completed with patient/family including follow up instructions, medication list, d/c activities limitations if indicated, with other d/c instructions as indicated by MD - patient able to verbalize understanding, all questions fully answered. Patient instructed to return to ED, call 911, or call MD for any changes in condition.  Patient escorted via [place], and D/C home via private auto.  Stanton Kidney  Alan Ripper, RN 03/25/2022 2:56 PM

## 2022-03-25 NOTE — Evaluation (Addendum)
Physical Therapy Evaluation Patient Details Name: Melissa [name] MRN: [id number] DOB: [birth year] Today's Date: 03/25/2022  History of Present Illness  66 y/o female s/p R TKA 11/29.  Clinical Impression  Pt eager to work with PT and did quite well.  She did not need any assist with bed mobility, transfers and was able to ambulate >300 ft w/o rest break of need for hands on assist. She reports modest pain at rest that does increase with ROM/exercises, but quickly back to 2/10 at rest.  She had full extension and flexion to the 80s, had good quad engagement and after some warm up exercises was able to AROM SLRs.  Pt doing well POD1 and has reached or exceeded POD1 expectations.  Will benefit from continued PT at home per TKA protocol to increase function and work toward return to independent PLOF.      Recommendations for follow up therapy are one component of a multi-disciplinary discharge planning process, led by the attending physician.  Recommendations may be updated based on patient status, additional functional criteria and insurance authorization.  Follow Up Recommendations Follow physician's recommendations for discharge plan and follow up therapies      Assistance Recommended at Discharge Intermittent Supervision/Assistance  Patient can return home with the following  Assist for transportation;Assistance with cooking/housework    Equipment Recommendations  (has walker, may need 3-in-1?)  Recommendations for Other Services       Functional Status Assessment Patient has had a recent decline in their functional status and demonstrates the ability to make significant improvements in function in a reasonable and predictable amount of time.     Precautions / Restrictions Precautions Precautions: Fall Restrictions Weight Bearing Restrictions: No RLE Weight Bearing: Weight bearing as tolerated      Mobility  Bed Mobility Overal bed mobility: Modified Independent              General bed mobility comments: Pt able to get herself to sitting EOB w/o assist, did not need excessive use of rails, etc    Transfers Overall transfer level: Modified independent Equipment used: Rolling walker (2 wheels)               General transfer comment: light cuing to insure appropriate hand placement, cues to step R LE out minimally on return to sitting.    Ambulation/Gait Ambulation/Gait assistance: Supervision Gait Distance (Feet): 300 Feet Assistive device: Rolling walker (2 wheels)         General Gait Details: Pt was able to show consistent walker momentum/movement straight away - did need light cuing to be deliberate during R LE weight acceptance concurent with quad engagement as she felt small buckling (easily self arrested with walker) when she did not actively do so  Stairs Stairs: Yes Stairs assistance: Supervision Stair Management: Two rails, Step to pattern Number of Stairs: 4 General stair comments: With minimal cuing/explanation pt was able to confidently and safety negotiate up/down steps w/o issue  Wheelchair Mobility    Modified Rankin (Stroke Patients Only)       Balance Overall balance assessment: Modified Independent                                           Pertinent Vitals/Pain Pain Assessment Pain Assessment: 0-10 Pain Score: 2  Pain Location: pain increases with exercises/ROM, but relatively minimal as soon as back at rest  Home Living Family/patient expects to be discharged to:: Private residence Living Arrangements: Alone Available Help at Discharge:  (niece is going to help, will stay 24/7 first few days)   Home Access: Stairs to enter Entrance Stairs-Rails: Can reach both Entrance Stairs-Number of Steps: [place] (2 wheels);Cane - single point      Prior Function Prior Level of Function : Independent/Modified Independent             Mobility Comments: Pt has  recently been limping and needing AD due to R knee pain, but generally is able and independent       Hand Dominance        Extremity/Trunk Assessment   Upper Extremity Assessment Upper Extremity Assessment: Overall WFL for tasks assessed    Lower Extremity Assessment Lower Extremity Assessment: Overall WFL for tasks assessed (expected post-op weakness)       Communication   Communication: No difficulties  Cognition Arousal/Alertness: Awake/alert Behavior During Therapy: WFL for tasks assessed/performed Overall Cognitive Status: Within Functional Limits for tasks assessed                                          General Comments General comments (skin integrity, edema, etc.): Pt highly motivated and very pleasant t/o session.  Great effort.    Exercises Total Joint Exercises Ankle Circles/Pumps: AROM, 10 reps Quad Sets: Strengthening, 10 reps Short Arc Quad: AROM, 10 reps Heel Slides: AAROM, AROM, 5 reps (with lightly resisted leg ext) Hip ABduction/ADduction: Strengthening, 10 reps Straight Leg Raises: AAROM, 10 reps (able to AROM SLRs after warming up with a few AAROM reps) Goniometric ROM: 0-80 AROM, 85 PROM   Assessment/Plan    PT Assessment Patient needs continued PT services  PT Problem List Decreased strength;Decreased range of motion;Decreased activity tolerance;Decreased balance;Decreased mobility;Decreased knowledge of use of DME;Decreased safety awareness;Pain       PT Treatment Interventions DME instruction;Gait training;Stair training;Functional mobility training;Therapeutic activities;Therapeutic exercise;Balance training;Patient/family education    PT Goals (Current goals can be found in the Care Plan section)  Acute Rehab PT Goals Patient Stated Goal: go home ASAP PT Goal Formulation: With patient Time For Goal Achievement: 04/07/22 Potential to Achieve Goals: Good    Frequency BID     Co-evaluation                AM-PAC PT "6 Clicks" Mobility  Outcome Measure Help needed turning from your back to your side while in a flat bed without using bedrails?: None Help needed moving from lying on your back to sitting on the side of a flat bed without using bedrails?: None Help needed moving to and from a bed to a chair (including a wheelchair)?: None Help needed standing up from a chair using your arms (e.g., wheelchair or bedside chair)?: None Help needed to walk in hospital room?: A Little Help needed climbing 3-5 steps with a railing? : A Little 6 Click Score: 22    End of Session Equipment Utilized During Treatment: Gait belt Activity Tolerance: Patient tolerated treatment well Patient left: in chair;with call bell/phone within reach;with nursing/sitter in room Nurse Communication: Mobility status PT Visit Diagnosis: Muscle weakness (generalized) (M62.81);Difficulty in walking, not elsewhere classified (R26.2);Pain Pain - Right/Left: Right Pain - part of body: Knee    Time: 0915-1000 PT Time Calculation (min) (ACUTE ONLY): 45 min  Charges:   PT Evaluation $PT Eval Low Complexity: 1 Low PT Treatments $Gait Training: 8-22 mins $Therapeutic Exercise: 8-22 mins        Kreg Shropshire, DPT 03/25/2022, 10:27 AM

## 2022-03-25 NOTE — Progress Notes (Signed)
Portable prevena attached, disconnected from hospital wound vac system

## 2022-03-25 NOTE — TOC Progression Note (Addendum)
Transition of Care [facility]) - Progression Note    Patient Details  Name: Melissa [name] MRN: [id number] Date of Birth: 1955-05-18  Transition of [facility]) CM/SW [place], RN Phone Number: 03/25/2022, 8:30 AM  Clinical Narrative:     The patient is set up with [facility] for Home health prior to [facility] office The patient reports during Joint class that she does have a rolling walker but will need a 3 in1, she also has a cane, she lives alone Melissa [name] will provide transportation home [employer] will deliver a 3 in 1 to the bedside       Expected Discharge Plan and Services                                                 Social Determinants of Health (SDOH) Interventions    Readmission Risk Interventions     No data to display

## 2022-03-25 NOTE — Progress Notes (Signed)
  Subjective: 1 Day Post-Op Procedure(s) (LRB): COMPUTER ASSISTED TOTAL KNEE ARTHROPLASTY (Right) Patient reports pain as well-controlled.   Patient is well, and has had no acute complaints or problems Plan is to go Home after hospital stay. Negative for chest pain and shortness of breath Fever: no Gastrointestinal: negative for nausea and vomiting.   Patient has not had a bowel movement.  Objective: Vital signs in last 24 hours: Temp:  [98.3 F (36.8 C)-98.7 F (37.1 C)] 98.4 F (36.9 C) (11/29 2352) Pulse Rate:  [78-91] 78 (11/29 2352) Resp:  [9-18] 16 (11/29 2352) BP: (132-179)/(74-103) 132/74 (11/29 2352) SpO2:  [90 %-100 %] 96 % (11/29 2352)  Intake/Output from previous day:  Intake/Output Summary (Last 24 hours) at 03/25/2022 1034 Last data filed at 03/25/2022 0134 Gross per 24 hour  Intake 2374.15 ml  Output 870 ml  Net 1504.15 ml    Intake/Output this shift: No intake/output data recorded.  Labs: No results for input(s): "HGB" in the last 72 hours. No results for input(s): "WBC", "RBC", "HCT", "PLT" in the last 72 hours. No results for input(s): "NA", "K", "CL", "CO2", "BUN", "CREATININE", "GLUCOSE", "CALCIUM" in the last 72 hours. No results for input(s): "LABPT", "INR" in the last 72 hours.   EXAM General - Patient is Alert, Appropriate, and Oriented Extremity - Neurovascular intact Dorsiflexion/Plantar flexion intact Compartment soft Dressing/Incision -Postoperative dressing remains in place., Following removal of post-op dressing, Prevena in place and working. No drainage in cannister.  Motor Function - intact, moving foot and toes well on exam. Able to perform SLR with assistance.      Assessment/Plan: 1 Day Post-Op Procedure(s) (LRB): COMPUTER ASSISTED TOTAL KNEE ARTHROPLASTY (Right) Principal Problem:   Total knee replacement status  Estimated body mass index is 41.6 kg/m as calculated from the following:   Height as of this encounter: '5\' 5"'$   (1.651 m).   Weight as of this encounter: 113.4 kg. Advance diet Up with therapy  Anticipate d/c this PM pending completion of therapy goals.   Post-op dressing removed. , Hemovac removed., and Mini compression dressing applied.   DVT Prophylaxis - Lovenox, Ted hose, and SCDs Weight-Bearing as tolerated to right leg  Cassell Smiles, PA-C [facility] 03/25/2022, 10:34 AM

## 2022-03-25 NOTE — Discharge Summary (Signed)
Physician Discharge Summary  Patient ID: Melissa [name] MRN: [id number] DOB/AGE: Apr 06, 1956 66 y.o.  Admit date: 03/24/2022 Discharge date: 03/25/2022  Admission Diagnoses:  Total knee replacement status [Z96.659]  Surgeries:Procedure(s): Right total knee arthroplasty using computer-assisted navigation   SURGEON:  Marciano Sequin. M.D.   ASSISTANT: Cassell Smiles, PA-C (present and scrubbed throughout the case, critical for assistance with exposure, retraction, instrumentation, and closure)   ANESTHESIA: general   ESTIMATED BLOOD LOSS: 50 mL   FLUIDS REPLACED: 1600 mL of crystalloid   TOURNIQUET TIME: 87 minutes   DRAINS: 2 medium Hemovac drains, wound VAC   SOFT TISSUE RELEASES: Anterior cruciate ligament, posterior cruciate ligament, deep medial collateral ligament, patellofemoral ligament   IMPLANTS UTILIZED: DePuy Attune size 6 posterior stabilized femoral component (cemented), size 6 rotating platform tibial component (cemented), 38 mm medialized dome patella (cemented), and a 5 mm stabilized rotating platform polyethylene insert.  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 female who was admitted 03/24/2022 with a diagnosis of right knee osteoarthritis and went to  the operating room on 03/24/2022 and underwent right total knee arthroplasty. The patient received perioperative antibiotics for prophylaxis (see below). The patient tolerated the procedure well and was transported to PACU in stable condition. After meeting PACU criteria, the patient was subsequently transferred to the Orthopaedics/Rehabilitation unit.   The patient received DVT prophylaxis in the form of early mobilization, Lovenox, TED hose, and SCDs . A sacral pad had been placed and heels were elevated off of the bed with rolled towels in order to protect skin integrity. Foley catheter was discontinued on postoperative day #0. Wound drains were discontinued on postoperative day #1. The surgical incision was healing well without signs of infection.  Physical therapy was initiated postoperatively for transfers, gait training, and strengthening. Occupational therapy was initiated for activities of daily living and evaluation for assisted devices. Rehabilitation goals were reviewed in detail with the patient. The patient made steady progress with physical therapy and physical therapy recommended discharge to Home.   The patient achieved the preliminary goals of this hospitalization and was felt to be medically and orthopaedically appropriate for discharge.  She was given perioperative antibiotics:  Anti-infectives (From admission, onward)    Start     Dose/Rate Route Frequency Ordered Stop   03/25/22 0133  ceFAZolin (ANCEF) 2-4 GM/100ML-% IVPB       Note to Pharmacy: Callie Fielding C: cabinet override      03/25/22 0133 03/25/22 0100   03/24/22 1815  ceFAZolin (ANCEF) IVPB 2g/100 mL premix        2 g 200 mL/hr over 30 Minutes Intravenous Every 6 hours 03/24/22 1809 03/25/22 0130   03/24/22 0600  ceFAZolin (ANCEF)  IVPB 3g/100 mL premix  Status:  Discontinued        3 g 200 mL/hr over 30 Minutes Intravenous On call to O.R. 03/24/22 0050 03/24/22 1758     .  Recent vital signs:  Vitals:    03/24/22 1809 03/24/22 2352  BP: 133/75 132/74  Pulse: 79 78  Resp: 16 16  Temp: 98.3 F (36.8 C) 98.4 F (36.9 C)  SpO2: 95% 96%    Recent laboratory studies:  No results for input(s): "WBC", "HGB", "HCT", "PLT", "K", "CL", "CO2", "BUN", "CREATININE", "GLUCOSE", "CALCIUM", "LABPT", "INR" in the last 72 hours.  Diagnostic Studies: DG Knee Right Port  Result Date: 03/24/2022 CLINICAL DATA:  Postop EXAM: PORTABLE RIGHT KNEE - 1-2 VIEW COMPARISON:  None Available. FINDINGS: Expected postsurgical changes from right knee arthroplasty. There is a drain in place. There is a small amount of air along the medial aspect of the right knee. No fracture. Normal alignment. Midline skin staples in place. IMPRESSION: Expected postsurgical changes from right knee arthroplasty. Electronically Signed   By: Marin Roberts M.D.   On: 03/24/2022 16:00    Discharge Medications:   Allergies as of 03/25/2022   No Known Allergies      Medication List     STOP taking these medications    ibuprofen 200 MG tablet Commonly known as: ADVIL       TAKE these medications    acetaminophen 325 MG tablet Commonly known as: TYLENOL Take 2 tablets (650 mg total) by mouth every 4 (four) hours as needed for mild pain ((score 1 to 3) or temp > 100.5).   celecoxib 200 MG capsule Commonly known as: CELEBREX Take 1 capsule (200 mg total) by mouth 2 (two) times daily.   cyclobenzaprine 5 MG tablet Commonly known as: FLEXERIL Take 5 mg by mouth 3 (three) times daily as needed for muscle spasms.   DULoxetine 60 MG capsule Commonly known as: CYMBALTA Take 60 mg by mouth every evening.   enoxaparin 40 MG/0.4ML injection Commonly known as: LOVENOX Inject 0.4 mLs (40 mg total) into the skin daily for 14 days.   oxyCODONE 5 MG immediate release tablet Commonly known as: Oxy IR/ROXICODONE Take 1 tablet (5 mg total) by mouth every 4 (four) hours as needed for severe pain.   traMADol 50 MG tablet Commonly known  as: ULTRAM Take 1 tablet (50 mg total) by mouth every 4 (four) hours as needed for moderate pain.   traZODone 100 MG tablet Commonly known as: DESYREL Take 100 mg by mouth at bedtime.               Durable Medical Equipment  (From admission, onward)           Start     Ordered   03/24/22 1810  DME Walker rolling  Once       Question:  Patient needs a walker to treat with the following condition  Answer:  Total knee replacement status   03/24/22 1809   03/24/22 1810  DME Bedside commode  Once       Comments: Patient is not able to walk the distance required to go the bathroom, or he/she is unable to safely negotiate stairs required to access the bathroom.  A 3in1 BSC will alleviate this problem  Question:  Patient needs a bedside commode to treat with the following condition  Answer:  Total knee replacement status   03/24/22 1809  Disposition: Home with home health PT     Follow-up Information     Fausto Skillern, PA-C Follow up on 04/08/2022.   Specialty: Orthopedic Surgery Why: at 10:45am Contact information: [place] [address] [phone]         Dereck Leep, MD Follow up on 05/06/2022.   Specialty: Orthopedic Surgery Why: at 10:45am Contact information: [place] [address] [place], PA-C 03/25/2022, 11:24 AM

## 2022-03-25 NOTE — Plan of Care (Signed)
progressing 

## 2022-03-25 NOTE — Evaluation (Signed)
Occupational Therapy Evaluation Patient Details Name: Melissa [name] MRN: [id number] DOB: 1955-05-04 Today's Date: 03/25/2022   History of Present Illness 66 y/o female s/p R TKA 11/29.   Clinical Impression   Pt seen for OT evaluation this date, POD#1 from above surgery. Pt was independent in all ADL prior to surgery, recently retired, and is eager to return to PLOF with less pain and improved safety and independence. Pt currently requires PRN minimal assist for LB dressing (particularly with compression stockings) while in seated position due to limited AROM and strength in R knee. Pt instructed in polar care mgt, falls prevention strategies, home/routines modifications, DME/AE for LB bathing and dressing tasks, and compression stocking mgt. Handout provided to support recall and carryover. Pt verbalized understanding of all instruction provided. Do not currently anticipate any additional acute OT needs. Will sign off.    Recommendations for follow up therapy are one component of a multi-disciplinary discharge planning process, led by the attending physician.  Recommendations may be updated based on patient status, additional functional criteria and insurance authorization.   Follow Up Recommendations  No OT follow up     Assistance Recommended at Discharge PRN  Patient can return home with the following A little help with bathing/dressing/bathroom;Assist for transportation;Assistance with cooking/housework;Help with stairs or ramp for entrance    Functional Status Assessment  Patient has had a recent decline in their functional status and demonstrates the ability to make significant improvements in function in a reasonable and predictable amount of time.  Equipment Recommendations  None recommended by OT    Recommendations for Other Services       Precautions / Restrictions Precautions Precautions: Fall Restrictions Weight Bearing Restrictions: Yes RLE Weight Bearing: Weight  bearing as tolerated      Mobility Bed Mobility               General bed mobility comments: NT, in recliner    Transfers Overall transfer level: Modified independent Equipment used: Rolling walker (2 wheels)                      Balance Overall balance assessment: Modified Independent                                         ADL either performed or assessed with clinical judgement   ADL                                         General ADL Comments: Pt currently requires PRN MIN A for LB ADL tasks, particularly compression stockings. Pt reports neice, best friend, neighbor all able to provide assist as needed     Vision         Perception     Praxis      Pertinent Vitals/Pain Pain Assessment Pain Assessment: No/denies pain Pain Location: reports "burning" on incision site but denies pain     Hand Dominance     Extremity/Trunk Assessment Upper Extremity Assessment Upper Extremity Assessment: Overall WFL for tasks assessed   Lower Extremity Assessment Lower Extremity Assessment: RLE deficits/detail RLE Deficits / Details: expected post-op strength/ROM deficits       Communication Communication Communication: No difficulties   Cognition Arousal/Alertness: Awake/alert Behavior During Therapy: WFL for tasks assessed/performed Overall Cognitive Status:  Within Functional Limits for tasks assessed                                       General Comments  Pt highly motivated and very pleasant t/o session.  Great effort.    Exercises Other Exercises Other Exercises: Pt instructed in home/routines modifications, falls prevention, compression stocking mgt, polar care mgt, and AE/DME. Handout provided to support recall and carryover.   Shoulder Instructions      Home Living Family/patient expects to be discharged to:: Private residence Living Arrangements: Alone Available Help at Discharge:  Family;Friend(s);Neighbor;Available PRN/intermittently (niece is going to help, will stay 24/7 first few days)   Home Access: Stairs to enter Entrance Stairs-Number of Steps: 4 Entrance Stairs-Rails: Can reach both       Bathroom Shower/Tub: Teacher, early years/pre: Standard     Home Equipment: Conservation officer, nature (2 wheels);Cane - single point;Adaptive equipment Adaptive Equipment: Reacher        Prior Functioning/Environment Prior Level of Function : Independent/Modified Independent;Driving             Mobility Comments: Pt has recently been limping and needing AD due to R knee pain, but generally is able and independent ADLs Comments: indep at baseline, recently retired        OT Problem List: Decreased strength;Decreased range of motion      OT Treatment/Interventions:      OT Goals(Current goals can be found in the care plan section) Acute Rehab OT Goals Patient Stated Goal: go home and get better OT Goal Formulation: All assessment and education complete, DC therapy  OT Frequency:      Co-evaluation              AM-PAC OT "6 Clicks" Daily Activity     Outcome Measure Help from another person eating meals?: None Help from another person taking care of personal grooming?: None Help from another person toileting, which includes using toliet, bedpan, or urinal?: None Help from another person bathing (including washing, rinsing, drying)?: A Little Help from another person to put on and taking off regular upper body clothing?: None Help from another person to put on and taking off regular lower body clothing?: A Little 6 Click Score: 22   End of Session Equipment Utilized During Treatment: Rolling walker (2 wheels)  Activity Tolerance: Patient tolerated treatment well Patient left: in chair;with call bell/phone within reach;Other (comment) (polar care in place)  OT Visit Diagnosis: Other abnormalities of gait and mobility (R26.89)                 Time: 1011-1030 OT Time Calculation (min): 19 min Charges:  OT General Charges $OT Visit: 1 Visit OT Evaluation $OT Eval Low Complexity: 1 Low OT Treatments $Self Care/Home Management : 8-22 mins  Ardeth Perfect., MPH, MS, OTR/L ascom [phone] 03/25/22, 10:55 AM

## 2022-06-27 DIAGNOSIS — M1712 Unilateral primary osteoarthritis, left knee: Principal | ICD-10-CM | POA: Insufficient documentation

## 2022-08-13 ENCOUNTER — Other Ambulatory Visit: Payer: Self-pay | Admitting: Internal Medicine

## 2022-08-13 DIAGNOSIS — Z1231 Encounter for screening mammogram for malignant neoplasm of breast: Secondary | ICD-10-CM

## 2022-08-17 ENCOUNTER — Encounter: Payer: Self-pay | Admitting: Internal Medicine

## 2022-08-17 ENCOUNTER — Other Ambulatory Visit (HOSPITAL_COMMUNITY): Payer: Self-pay | Admitting: Internal Medicine

## 2022-08-17 DIAGNOSIS — R0609 Other forms of dyspnea: Secondary | ICD-10-CM

## 2022-09-08 ENCOUNTER — Telehealth (HOSPITAL_COMMUNITY): Payer: Self-pay | Admitting: Emergency Medicine

## 2022-09-08 DIAGNOSIS — R079 Chest pain, unspecified: Secondary | ICD-10-CM

## 2022-09-08 MED ORDER — METOPROLOL TARTRATE 100 MG PO TABS: 100.0000 mg | ORAL_TABLET | Freq: Once | ORAL | 0 refills | Status: DC

## 2022-09-08 NOTE — Telephone Encounter (Signed)
Reaching out to patient to offer assistance regarding upcoming cardiac imaging study; pt verbalizes understanding of appt date/time, parking situation and where to check in, pre-test NPO status and medications ordered, and verified current allergies; name and call back number provided for further questions should they arise   RN Navigator Cardiac Imaging [facility] [phone] office [phone] cell  100mg metoprolol tart  

## 2022-09-09 ENCOUNTER — Ambulatory Visit
Admission: RE | Admit: 2022-09-09 | Discharge: 2022-09-09 | Disposition: A | Discharge status: Home / Self Care | Payer: Medicare Other | Source: Ambulatory Visit | Attending: Internal Medicine | Admitting: Internal Medicine

## 2022-09-09 DIAGNOSIS — I2510 Atherosclerotic heart disease of native coronary artery without angina pectoris: Secondary | ICD-10-CM | POA: Insufficient documentation

## 2022-09-09 DIAGNOSIS — K449 Diaphragmatic hernia without obstruction or gangrene: Secondary | ICD-10-CM | POA: Diagnosis not present

## 2022-09-09 DIAGNOSIS — R0609 Other forms of dyspnea: Secondary | ICD-10-CM

## 2022-09-09 MED ORDER — IOHEXOL 350 MG/ML SOLN
100.0000 mL | Freq: Once | INTRAVENOUS | Status: AC | PRN
  Administered 2022-09-09: 100 mL via INTRAVENOUS

## 2022-09-09 MED ORDER — METOPROLOL TARTRATE 5 MG/5ML IV SOLN
5.0000 mg | Freq: Once | INTRAVENOUS | Status: AC
  Administered 2022-09-09: 5 mg via INTRAVENOUS

## 2022-09-09 MED ORDER — NITROGLYCERIN 0.4 MG SL SUBL
0.8000 mg | SUBLINGUAL_TABLET | Freq: Once | SUBLINGUAL | Status: AC
  Administered 2022-09-09: 0.8 mg via SUBLINGUAL

## 2022-09-09 NOTE — Progress Notes (Signed)
Patient tolerated procedure well. Ambulate w/o difficulty. Denies light headedness or being dizzy. Sitting in chair drinking water provided. Encouraged to drink extra water today and reasoning explained. Verbalized understanding. All questions answered. ABC intact. No further needs. Discharge from procedure area w/o issues.   °

## 2022-09-13 ENCOUNTER — Ambulatory Visit
Admission: RE | Admit: 2022-09-13 | Discharge: 2022-09-13 | Disposition: A | Discharge status: Home / Self Care | Payer: Medicare Other | Source: Ambulatory Visit | Attending: Internal Medicine | Admitting: Internal Medicine

## 2022-09-13 DIAGNOSIS — Z1231 Encounter for screening mammogram for malignant neoplasm of breast: Secondary | ICD-10-CM

## 2022-09-17 ENCOUNTER — Other Ambulatory Visit: Payer: Self-pay | Admitting: Internal Medicine

## 2022-09-17 DIAGNOSIS — R921 Mammographic calcification found on diagnostic imaging of breast: Secondary | ICD-10-CM

## 2022-09-17 DIAGNOSIS — R928 Other abnormal and inconclusive findings on diagnostic imaging of breast: Secondary | ICD-10-CM

## 2022-09-24 ENCOUNTER — Ambulatory Visit
Admission: RE | Admit: 2022-09-24 | Discharge: 2022-09-24 | Disposition: A | Discharge status: Home / Self Care | Payer: Medicare Other | Source: Ambulatory Visit | Attending: Internal Medicine | Admitting: Internal Medicine

## 2022-09-24 DIAGNOSIS — R928 Other abnormal and inconclusive findings on diagnostic imaging of breast: Secondary | ICD-10-CM | POA: Insufficient documentation

## 2022-09-24 DIAGNOSIS — R921 Mammographic calcification found on diagnostic imaging of breast: Secondary | ICD-10-CM | POA: Diagnosis present

## 2022-11-21 NOTE — Discharge Instructions (Addendum)
Instructions after Total Knee Replacement   James P. Angie Fava., M.D.    Dept. of Orthopaedics & Sports Medicine [facility] [address] [place], [place]  [address]  Phone: [phone]   Fax: [phone]       [email or web address]       DIET: Drink plenty of non-alcoholic fluids. Resume your normal diet. Include foods high in fiber.  ACTIVITY:  You may use crutches or a walker with weight-bearing as tolerated, unless instructed otherwise. You may be weaned off of the walker or crutches by your Physical Therapist.  Do NOT place pillows under the knee. Anything placed under the knee could limit your ability to straighten the knee.   Continue doing gentle exercises. Exercising will reduce the pain and swelling, increase motion, and prevent muscle weakness.   Please continue to use the TED compression stockings for 6 weeks. You may remove the stockings at night, but should reapply them in the morning. Do not drive or operate any equipment until instructed.  WOUND CARE:  Continue to use the PolarCare or ice packs periodically to reduce pain and swelling. You may bathe or shower after the staples are removed at the first office visit following surgery.  MEDICATIONS: You may resume your regular medications. Please take the pain medication as prescribed on the medication. Do not take pain medication on an empty stomach. Take aspirin 81 mg twice a day for DVT prophylaxis. Do not drive or drink alcoholic beverages when taking pain medications.  CALL THE OFFICE FOR: Temperature above 101 degrees Excessive bleeding or drainage on the dressing. Excessive swelling, coldness, or paleness of the toes. Persistent nausea and vomiting.  FOLLOW-UP:  You should have an appointment to return to the office in 10-14 days after surgery. Arrangements have been made for continuation of Physical Therapy (either home therapy or outpatient therapy).     [facility] Department  Directory         [email or web address]       [email or web address]          Cardiology  Appointments: [place] - [phone]  Endocrinology  Appointments: [place] [phone] [place] - [phone]  Gastroenterology  Appointments: [place] [phone] [place] - [phone]        General Surgery   Appointments: [facility]  Internal Medicine/Family Medicine  Appointments: [facility] - [phone] [place] - [phone]  Metabolic and Weigh Loss Surgery  Appointments: [facility]        Neurology  Appointments: [place] [phone] [place] - [phone]  Neurosurgery  Appointments: [place]  Obstetrics & Gynecology  Appointments: [place] [phone] [place] - [phone]        Pediatrics  Appointments: [place] [phone] [place] - [phone]  Physiatry  Appointments: [place] [phone]  Physical Therapy  Appointments: [place] - [phone]        Podiatry  Appointments: [place] [phone] [place] - [phone]  Pulmonology  Appointments: [place]  Rheumatology  Appointments: [place] [phone]        [place] Location: [facility]  [address] [place], [place]  [address]  [place] Location: [facility] [address] [place], [place]  [address]  [place] Location: [facility] [address] [place], [place]  [address]    Food Resources  Agency Name: [facility] Address: [address], [place], [place] [address] Phone: [phone] Website: [email or web address] Service(s) Offered: [employer], self-sufficiency, congregate meal program, weatherization program, heating  appliance repair/replacement program, emergency food assistance,  housing counseling, home ownership program, wheels  - to work program.  **Meals free for 60 and older at various locations from 9am-1pm, Monday-Friday:  [employer], [address] [place], [phone] -[facility], [address], [place] [phone]  -[facility], [address], [place] [phone]  -[address], [address] [place], [phone]  Agency Name: [facility] Address: [phone] [address], [place], [place] [address] Phone: [phone] Website: www.[email or web address] Service(s) Offered: Home delivered hot, frozen, and emergency  meals. Grocery assistance program which matches  volunteers one-on-one with seniors unable to grocery shop  for themselves. Must be 60 years and older; less than 20  hours of in-home aide service, limited or no driving ability;  live alone or with someone with a disability; live in  [place].  Agency Name: [facility]) Address: [address] [place], [place] [address] Phone: [phone] Service(s) Offered: Food is served to shut-ins, homeless, elderly, and low income people in the community every Saturday (11:30 am-12:30 pm) and Sunday (12:30 pm-1:30pm). Volunteers also offer help and encouragement in seeking employment,  and spiritual guidance.  Agency Name: [employer] Address: [address] [place], [place] [address] Phone: [phone] Service(s) Offered: Child support services; child welfare services; food stamps; Medicaid; work first family assistance; and aid with fuel,  rent, food and medicine.  Agency Name: [employer] Address: [address], [place], [place] Phone: [phone] Website: [email or web address] Services Offered: Monday 10:00am-12:00, 8:00pm-9:00pm, and Friday 10:00am-12:00.  Agency Name: [employer] Address: [address] [place], [place] [address] Phone: [phone] Website:  [email or web address].org Service(s) Offered: Serves weekday meals, open from 11:30 am- 1:00 pm., and 6:30-7:30pm, Monday-Wednesday-Friday distributes food 3:30-6pm, Monday-Wednesday-Friday.  Agency Name: [facility] Address: [address], [place], [place] Phone: [phone] Website: [email or web address].[email or web address] Services Offered: Distributes food the 4th Saturday of the month, starting at 8:00 am  Agency Name: [facility] Address: [phone] [address], [place], [place] [address] Phone: [phone] Website: [email or web address] Service(s) Offered: Bread of life, weekly food pantry. Open Wednesdays from 10:00am-noon.  Agency Name: [facility] [employer] Address: [address] [place], [place], [place] Phone: [phone] Services Offered: Distributes food 9am-1pm, Monday-Thursday. Call for details.  Agency Name: [facility] Address: [address], [place], [place] [address] Phone: [phone] Website: [email or web address] Service(s) Offered: Games developer. Call for assistance.  Agency Name: [facility] Address: [address] [place], [place] [address] Phone: [phone] Service Offered: Emergency Food Pantry. Call for appointment.  Agency Name: [facility] Address: [address] [place], [place] [address] Phone: [phone] Website: [email or web address] Services Offered: Games developer. Call for details  Agency Name: [facility] Address: [address] [place], [place] Phone: [phone] Website: [email or web address] Service(s) Offered: Emergency Food Pantry. Call for details.  Agency Name: Holiday representative Address: [address], [place], [place] [address] Phone: [phone] or [phone] Website: [email or web address] Service(s) Offered: Distribute food 9am-11:30 am, Tuesday-Friday, and 1-3:30pm, Monday-Friday. Food pantry  Monday-Friday 1pm-3pm, fresh items, Mon.-Wed.-Fri.  Agency Name: [facility] Address: [address] [place], [place] [address] Phone: [phone] Website: www.safealamance.org Services Offered: Distribute food Tues and Sats from 9:00am-noon. Closed 1st Saturday of each month. Call for details

## 2022-11-26 ENCOUNTER — Other Ambulatory Visit: Payer: Self-pay

## 2022-11-26 ENCOUNTER — Encounter
Admission: RE | Admit: 2022-11-26 | Discharge: 2022-11-26 | Disposition: A | Discharge status: Home / Self Care | Payer: Medicare Other | Source: Ambulatory Visit | Attending: Orthopedic Surgery | Admitting: Orthopedic Surgery

## 2022-11-26 VITALS — BP 149/83 | HR 73 | Temp 98.0°F | Resp 18 | Ht 66.0 in | Wt 253.1 lb

## 2022-11-26 DIAGNOSIS — R9431 Abnormal electrocardiogram [ECG] [EKG]: Secondary | ICD-10-CM | POA: Insufficient documentation

## 2022-11-26 DIAGNOSIS — Z96652 Presence of left artificial knee joint: Secondary | ICD-10-CM | POA: Insufficient documentation

## 2022-11-26 DIAGNOSIS — M1712 Unilateral primary osteoarthritis, left knee: Secondary | ICD-10-CM | POA: Diagnosis not present

## 2022-11-26 DIAGNOSIS — Z01818 Encounter for other preprocedural examination: Secondary | ICD-10-CM | POA: Diagnosis not present

## 2022-11-26 DIAGNOSIS — Z01812 Encounter for preprocedural laboratory examination: Secondary | ICD-10-CM

## 2022-11-26 HISTORY — DX: Disorder of the skin and subcutaneous tissue, unspecified: L98.9

## 2022-11-26 HISTORY — DX: Other forms of dyspnea: R06.09

## 2022-11-26 HISTORY — DX: Nausea with vomiting, unspecified: R11.2

## 2022-11-26 HISTORY — DX: Diaphragmatic hernia without obstruction or gangrene: K44.9

## 2022-11-26 HISTORY — DX: Spondylolisthesis, site unspecified: M43.10

## 2022-11-26 HISTORY — DX: Prediabetes: R73.03

## 2022-11-26 HISTORY — DX: Radiculopathy, lumbar region: M54.16

## 2022-11-26 HISTORY — DX: Other specified postprocedural states: Z98.890

## 2022-11-26 LAB — CBC
HCT: 45.1 % (ref 36.0–46.0)
Hemoglobin: 13.6 g/dL (ref 12.0–15.0)
MCH: 26 pg (ref 26.0–34.0)
MCHC: 30.2 g/dL (ref 30.0–36.0)
MCV: 86.1 fL (ref 80.0–100.0)
Platelets: 195 10*3/uL (ref 150–400)
RBC: 5.24 MIL/uL — ABNORMAL HIGH (ref 3.87–5.11)
RDW: 15.1 % (ref 11.5–15.5)
WBC: 15 10*3/uL — ABNORMAL HIGH (ref 4.0–10.5)
nRBC: 0 % (ref 0.0–0.2)

## 2022-11-26 LAB — URINALYSIS, ROUTINE W REFLEX MICROSCOPIC
Bacteria, UA: NONE SEEN
Bilirubin Urine: NEGATIVE
Glucose, UA: NEGATIVE mg/dL
Hgb urine dipstick: NEGATIVE
Ketones, ur: 5 mg/dL — AB
Nitrite: NEGATIVE
Protein, ur: 30 mg/dL — AB
Specific Gravity, Urine: 1.031 — ABNORMAL HIGH (ref 1.005–1.030)
pH: 5 (ref 5.0–8.0)

## 2022-11-26 LAB — COMPREHENSIVE METABOLIC PANEL
ALT: 18 U/L (ref 0–44)
AST: 22 U/L (ref 15–41)
Albumin: 3.8 g/dL (ref 3.5–5.0)
Alkaline Phosphatase: 74 U/L (ref 38–126)
Anion gap: 11 (ref 5–15)
BUN: 30 mg/dL — ABNORMAL HIGH (ref 8–23)
CO2: 23 mmol/L (ref 22–32)
Calcium: 8.9 mg/dL (ref 8.9–10.3)
Chloride: 106 mmol/L (ref 98–111)
Creatinine, Ser: 0.81 mg/dL (ref 0.44–1.00)
GFR, Estimated: >60 mL/min (ref >60)
Glucose, Bld: 75 mg/dL (ref 70–99)
Potassium: 3.6 mmol/L (ref 3.5–5.1)
Sodium: 140 mmol/L (ref 135–145)
Total Bilirubin: 0.8 mg/dL (ref 0.3–1.2)
Total Protein: 7.1 g/dL (ref 6.5–8.1)

## 2022-11-26 LAB — SEDIMENTATION RATE: Sed Rate: 5 mm/hr (ref 0–30)

## 2022-11-26 LAB — TYPE AND SCREEN
ABO/RH(D): O POS
Antibody Screen: NEGATIVE

## 2022-11-26 LAB — SURGICAL PCR SCREEN
MRSA, PCR: NEGATIVE
Staphylococcus aureus: POSITIVE — AB

## 2022-11-26 LAB — C-REACTIVE PROTEIN: CRP: 0.7 mg/dL (ref <1.0)

## 2022-11-26 NOTE — Patient Instructions (Addendum)
Your procedure is scheduled on: Friday, August 9  Report to the Registration Desk on the 1st floor of the [facility] To find out your arrival time, please call [phone] between 1PM - 3PM on: Thursday August 8  If your arrival time is 6:00 am, do not arrive before that time as the [facility] entrance doors do not open until 6:00 am.  REMEMBER: Instructions that are not followed completely may result in serious medical risk, up to and including death; or upon the discretion of your surgeon and anesthesiologist your surgery may need to be rescheduled.  Do not eat food after midnight the night before surgery.  No gum chewing or hard candies.  You may however, drink CLEAR liquids up to 4:30 AM . Do not drink anything after 4:30 AM.  Clear liquids include: - water  - apple juice without pulp - gatorade (not RED colors) - black coffee or tea (Do NOT add milk or creamers to the coffee or tea) Do NOT drink anything that is not on this list.  In addition, your doctor has ordered for you to drink the provided:  Ensure Pre-Surgery Clear Carbohydrate Drink  Drinking this carbohydrate drink up to two hours before surgery helps to reduce insulin resistance and improve patient outcomes. Please complete drinking by 4:30 AM .  One week prior to surgery: Starting Friday August 2  Stop Anti-inflammatories (NSAIDS) such as Advil, Aleve, Ibuprofen, Motrin, Naproxen, Naprosyn and Aspirin based products such as Excedrin, Goody's Powder, BC Powder. Stop ANY OVER THE COUNTER supplements until after surgery. You may however, continue to take Tylenol if needed for pain up until the day of surgery.  DO NOT TAKE  MEDICATIONS THE MORNING OF SURGERY.   No Alcohol for 24 hours before or after surgery.  No Smoking including e-cigarettes for 24 hours before surgery.  No chewable tobacco products for at least 6 hours before surgery.  No nicotine patches on the day of surgery.  Do not use any  "recreational" drugs for at least a week (preferably 2 weeks) before your surgery.  Please be advised that the combination of cocaine and anesthesia may have negative outcomes, up to and including death. If you test positive for cocaine, your surgery will be cancelled.  On the morning of surgery brush your teeth with toothpaste and water, you may rinse your mouth with mouthwash if you wish. Do not swallow any toothpaste or mouthwash.  Use CHG Soap as directed on instruction sheet.  Do not wear jewelry, make-up, hairpins, clips or nail polish.  Do not wear lotions, powders, or perfumes.   Do not shave body hair from the neck down 48 hours before surgery.  Contact lenses, hearing aids and dentures may not be worn into surgery.  Do not bring valuables to the hospital. [facility] is not responsible for any missing/lost belongings or valuables.   Notify your doctor if there is any change in your medical condition (cold, fever, infection).  Wear comfortable clothing (specific to your surgery type) to the hospital.  After surgery, you can help prevent lung complications by doing breathing exercises.  Take deep breaths and cough every 1-2 hours.  If you are being admitted to the hospital overnight, leave your suitcase in the car. After surgery it may be brought to your room.  In case of increased patient census, it may be necessary for you, the patient, to continue your postoperative care in the Same Day Surgery department.  Please call the Pre-admissions  Testing Dept. at [phone] if you have any questions about these instructions.  Surgery Visitation Policy:  Patients having surgery or a procedure may have two visitors.  Children under the age of 2 must have an adult with them who is not the patient.  Inpatient Visitation:    Visiting hours are 7 a.m. to 8 p.m. Up to four visitors are allowed at one time in a patient room. The visitors may rotate out with other people during  the day.  One visitor age 24 or older may stay with the patient overnight and must be in the room by 8 p.m.      Pre-operative 5 CHG Bath Instructions   You can play a key role in reducing the risk of infection after surgery. Your skin needs to be as free of germs as possible. You can reduce the number of germs on your skin by washing with CHG (chlorhexidine gluconate) soap before surgery. CHG is an antiseptic soap that kills germs and continues to kill germs even after washing.   DO NOT use if you have an allergy to chlorhexidine/CHG or antibacterial soaps. If your skin becomes reddened or irritated, stop using the CHG and notify one of our RNs at [phone].   Please shower with the CHG soap starting 4 days before surgery using the following schedule:     Please keep in mind the following:  DO NOT shave, including legs and underarms, starting the day of your first shower.   You may shave your face at any point before/day of surgery.  Place clean sheets on your bed the day you start using CHG soap. Use a clean washcloth (not used since being washed) for each shower. DO NOT sleep with pets once you start using the CHG.   CHG Shower Instructions:  If you choose to wash your hair and private area, wash first with your normal shampoo/soap.  After you use shampoo/soap, rinse your hair and body thoroughly to remove shampoo/soap residue.  Turn the water OFF and apply about 3 tablespoons (45 ml) of CHG soap to a CLEAN washcloth.  Apply CHG soap ONLY FROM YOUR NECK DOWN TO YOUR TOES (washing for 3-5 minutes)  DO NOT use CHG soap on face, private areas, open wounds, or sores.  Pay special attention to the area where your surgery is being performed.  If you are having back surgery, having someone wash your back for you may be helpful. Wait 2 minutes after CHG soap is applied, then you may rinse off the CHG soap.  Pat dry with a clean towel  Put on clean clothes/pajamas   If you choose to  wear lotion, please use ONLY the CHG-compatible lotions on the back of this paper.     Additional instructions for the day of surgery: DO NOT APPLY any lotions, deodorants, cologne, or perfumes.   Put on clean/comfortable clothes.  Brush your teeth.  Ask your nurse before applying any prescription medications to the skin.      CHG Compatible Lotions   Aveeno Moisturizing lotion  Cetaphil Moisturizing Cream  Cetaphil Moisturizing Lotion  Clairol Herbal Essence Moisturizing Lotion, Dry Skin  Clairol Herbal Essence Moisturizing Lotion, Extra Dry Skin  Clairol Herbal Essence Moisturizing Lotion, Normal Skin  Curel Age Defying Therapeutic Moisturizing Lotion with Alpha Hydroxy  Curel Extreme Care Body Lotion  Curel Soothing Hands Moisturizing Hand Lotion  Curel Therapeutic Moisturizing Cream, Fragrance-Free  Curel Therapeutic Moisturizing Lotion, Fragrance-Free  Curel Therapeutic Moisturizing Lotion, Original Formula  Eucerin Daily Replenishing Lotion  Eucerin Dry Skin Therapy Plus Alpha Hydroxy Crme  Eucerin Dry Skin Therapy Plus Alpha Hydroxy Lotion  Eucerin Original Crme  Eucerin Original Lotion  Eucerin Plus Crme Eucerin Plus Lotion  Eucerin TriLipid Replenishing Lotion  Keri Anti-Bacterial Hand Lotion  Keri Deep Conditioning Original Lotion Dry Skin Formula Softly Scented  Keri Deep Conditioning Original Lotion, Fragrance Free Sensitive Skin Formula  Keri Lotion Fast Absorbing Fragrance Free Sensitive Skin Formula  Keri Lotion Fast Absorbing Softly Scented Dry Skin Formula  Keri Original Lotion  Keri Skin Renewal Lotion Keri Silky Smooth Lotion  Keri Silky Smooth Sensitive Skin Lotion  Nivea Body Creamy Conditioning Oil  Nivea Body Extra Enriched Teacher, adult education Moisturizing Lotion Nivea Crme  Nivea Skin Firming Lotion  NutraDerm 30 Skin Lotion  NutraDerm Skin Lotion  NutraDerm Therapeutic Skin Cream  NutraDerm Therapeutic Skin  Lotion  ProShield Protective Hand Cream  Provon moisturizing lotion

## 2022-11-30 NOTE — H&P (Signed)
ORTHOPAEDIC HISTORY & PHYSICAL , Melissa [name]., MD - 11/09/2022 2:30 PM EDT Formatting of this note is different from the original. Images from the original note were not included. 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 female who presents today for reevaluation of both knees. She is 7 months status post right total knee arthroplasty. She denies any significant right knee pain, swelling, or giving way. She states that the right knee is doing well, but the left knee is limiting her activities.  She has a long history of left knee pain. She localizes most of the pain along the anterior aspect of the left knee. She reports some swelling, some locking, and some giving way of the left knee. The pain is aggravated by going up and down stairs and rising after sitting. The left knee pain limits the patient's ability to ambulate long distances. The patient has not appreciated any significant improvement despite Tylenol, NSAIDs, intraarticular corticosteroid injections, viscosupplementation, and activity modification. She is not using any ambulatory aids. The patient states that the left knee pain has progressed to the point that it is significantly interfering with her activities of daily living.  Medications: Current Outpatient Medications Medication Sig Dispense Refill acetaminophen (TYLENOL) 650 MG ER tablet Take 1,300 mg by mouth 2 (two) times daily as needed for Pain fluticasone propionate (FLONASE) 50 mcg/actuation nasal spray USE 2 SPRAYS INTO BOTH NOSTRILS TWICE DAILY 16 g 5 ibuprofen (ADVIL) 200 MG tablet Take 600 mg by mouth every 6 (six) hours as needed for Pain pseudoephedrine (SUDAFED) 30 mg tablet Take 30 mg by mouth once daily [employer] brand  No current facility-administered medications for this visit.  Allergies: No  Known Allergies  Past Medical History: Past Medical History: Diagnosis Date Anxiety Arthritis Depression 2004 GERD (gastroesophageal reflux disease) Hypertension Skin cancer nose Spinal stenosis  Past Surgical History: Past Surgical History: Procedure Laterality Date dental surgery 09/2017 removal of bottom teeth L4-5 lateral lumbar interbody fusion with posterior fixtation 09/12/2019 Dr. Venetia Night at [facility] [place] Right total knee arthroplasty using computer-assisted navigation 03/24/2022 Dr Ernest Pine COLONOSCOPY  Social History: Social History  Socioeconomic History Marital status: Widowed Number of children: 0 Years of education: 12 Highest education level: High school graduate Occupational History Occupation: Wellsite geologist Tobacco Use Smoking status: Former Current packs/day: 0.00 Average packs/day: 1 pack/day for 6.0 years (6.0 ttl pk-yrs) Types: Cigarettes Start date: 2000 Quit date: 2006 Years since quitting: 18.5 Smokeless tobacco: Never Vaping Use Vaping status: Never Used Substance and Sexual Activity Alcohol use: No Drug use: No Sexual activity: Defer  Social Determinants of Health  Financial Resource Strain: Low Risk (08/13/2022) Overall Financial Resource Strain (CARDIA) Difficulty of Paying Living Expenses: Not hard at all Food Insecurity: Food Insecurity Present (08/13/2022) Hunger Vital Sign Worried About Running Out of Food in the Last Year: Sometimes true Ran Out of Food in the Last Year: Sometimes true Transportation Needs: No Transportation Needs (08/13/2022) PRAPARE - Contractor (Medical): No Lack of Transportation (Non-Medical): No Housing Stability: Unknown (08/13/2022) Housing Stability Vital Sign Unable to Pay for Housing in the Last Year: No Homeless in the Last Year: No  Family History: Family History Problem Relation Name Age of Onset Dementia Mother Stroke Mother High blood  pressure (Hypertension) Mother Anxiety Mother Depression Mother Kidney failure Father Arthritis Father Heart failure Father High blood pressure (Hypertension) Brother  Cancer Brother High blood pressure (Hypertension) Brother GI problems Brother  Review of Systems: A comprehensive 14 point ROS was performed, reviewed, and the pertinent orthopaedic findings are documented in the HPI.  Exam BP 124/80  Ht 165.1 cm (5\' 5" )  Wt (!) 114.5 kg (252 lb 6.4 oz)  BMI 42.00 kg/m  General: Well-developed, well-nourished female seen in no acute distress. Antalgic gait. No significant varus or valgus thrust to either knee.  HEENT: Atraumatic, normocephalic. Pupils are equal and reactive to light. Extraocular motion is intact. Sclera are clear. Oropharynx is clear with moist mucosa.  Neck: Supple, nontender, and with good ROM. No thyromegaly, adenopathy, JVD, or carotid bruits.  Lungs: Clear to auscultation bilaterally.  Cardiovascular: Regular rate and rhythm. Normal S1, S2. No murmur . No appreciable gallops or rubs. Peripheral pulses are palpable. No lower extremity edema. Homan`s test is negative.  Abdomen: Soft, nontender, nondistended. Bowel sounds are present.  Extremities: Good strength, stability, and range of motion of the upper extremities. Good range of motion of the hips and ankles.  Left Knee: Soft tissue swelling: mild Effusion: none Erythema: none Crepitance: moderate Tenderness: Anterior; Patellar grind test is positive. Alignment: normal Mediolateral laxity: stable Posterior sag: negative Patellar tracking: Good tracking without evidence of subluxation or tilt Atrophy: No significant atrophy. Quadriceps tone was fair to good. Range of motion: 0/8/118 degrees  Right Knee: Soft tissue swelling: none Effusion: none Erythema: none Crepitance: none Tenderness: No focal tenderness Alignment: normal Mediolateral laxity: stable Atrophy: No significant  atrophy. Quadriceps tone was fair to good. Range of Motion: 0/0/123 degrees  Neurologic: Awake, alert, and oriented. Sensory function is intact to pinprick and light touch. Motor strength is judged to be 5/5. Motor coordination is within normal limits. No apparent clonus. No tremor.  X-rays: I ordered and interpreted standing AP, lateral, and sunrise radiographs of the left knee that were obtained in the office today. There is narrowing of the lateral cartilage space. Osteophyte formation is noted. Degenerative changes to the patellofemoral articulation are noted. Subchondral sclerosis is noted. No evidence of fracture or dislocation.  I ordered and interpreted standing AP, lateral, and sunrise views of the right knee that were obtained in the office today. Good position of the total knee implants. Good alignment is noted on the AP view. Good cement mantle is appreciated without evidence of loosening. No evidence of polyethylene wear or osteolysis. No evidence of fracture or dislocation.  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 She continues to do well with the right knee. The patient was given informational material on total knee replacement. Conservative treatment options were reviewed with the patient. We discussed the risks and benefits of surgical intervention. The usual perioperative course was also discussed in detail. The patient expressed understanding of the risks and benefits of surgical intervention and would like to proceed with plans for left total knee arthroplasty.  I spent a total of 45 minutes in both face-to-face and non-face-to-face activities, excluding procedures performed, for this visit on the date of this encounter.  MEDICAL CLEARANCE: Per anesthesiology. ACTIVITY: As tolerated. WORK STATUS: Not applicable. THERAPY: Preoperative physical therapy  evaluation. MEDICATIONS: Requested Prescriptions  No prescriptions requested or ordered in this encounter  FOLLOW-UP: Return for preop History & Physical pending surgery date.   P. Angie Fava., M.D.  This note was generated in part with voice recognition software and I apologize for any typographical errors  that were not detected and corrected.  Electronically signed by Shari Heritage., MD at 11/13/2022 6:31 PM EDT

## 2022-12-03 ENCOUNTER — Observation Stay: Payer: Medicare Other

## 2022-12-03 ENCOUNTER — Ambulatory Visit: Payer: Medicare Other | Admitting: Urgent Care

## 2022-12-03 ENCOUNTER — Encounter
Admission: RE | Disposition: A | Discharge status: Home Health | Payer: Self-pay | Source: Home / Self Care | Attending: Orthopedic Surgery

## 2022-12-03 ENCOUNTER — Other Ambulatory Visit: Payer: Self-pay

## 2022-12-03 ENCOUNTER — Ambulatory Visit: Payer: Medicare Other | Admitting: Anesthesiology

## 2022-12-03 ENCOUNTER — Encounter: Payer: Self-pay | Admitting: Orthopedic Surgery

## 2022-12-03 ENCOUNTER — Observation Stay
Admission: RE | Admit: 2022-12-03 | Discharge: 2022-12-04 | Disposition: A | Discharge status: Home Health | Payer: Medicare Other | Attending: Orthopedic Surgery | Admitting: Orthopedic Surgery

## 2022-12-03 DIAGNOSIS — Z96659 Presence of unspecified artificial knee joint: Secondary | ICD-10-CM

## 2022-12-03 DIAGNOSIS — Z96651 Presence of right artificial knee joint: Secondary | ICD-10-CM | POA: Insufficient documentation

## 2022-12-03 DIAGNOSIS — Z85828 Personal history of other malignant neoplasm of skin: Secondary | ICD-10-CM | POA: Insufficient documentation

## 2022-12-03 DIAGNOSIS — Z87891 Personal history of nicotine dependence: Secondary | ICD-10-CM | POA: Insufficient documentation

## 2022-12-03 DIAGNOSIS — M1712 Unilateral primary osteoarthritis, left knee: Secondary | ICD-10-CM | POA: Diagnosis present

## 2022-12-03 DIAGNOSIS — I10 Essential (primary) hypertension: Secondary | ICD-10-CM | POA: Insufficient documentation

## 2022-12-03 DIAGNOSIS — Z79899 Other long term (current) drug therapy: Secondary | ICD-10-CM | POA: Diagnosis not present

## 2022-12-03 HISTORY — PX: KNEE ARTHROPLASTY: SHX992

## 2022-12-03 SURGERY — ARTHROPLASTY, KNEE, TOTAL, USING IMAGELESS COMPUTER-ASSISTED NAVIGATION
Anesthesia: General | Site: Knee | Laterality: Left

## 2022-12-03 MED ORDER — LABETALOL HCL 5 MG/ML IV SOLN
INTRAVENOUS | Status: AC
  Filled 2022-12-03: qty 4

## 2022-12-03 MED ORDER — PROPOFOL 10 MG/ML IV BOLUS
INTRAVENOUS | Status: DC | PRN
  Administered 2022-12-03: 100 mg via INTRAVENOUS

## 2022-12-03 MED ORDER — GLYCOPYRROLATE 0.2 MG/ML IJ SOLN
INTRAMUSCULAR | Status: DC | PRN
  Administered 2022-12-03: .6 mg via INTRAVENOUS

## 2022-12-03 MED ORDER — KETAMINE HCL 50 MG/5ML IJ SOSY
PREFILLED_SYRINGE | INTRAMUSCULAR | Status: AC
  Filled 2022-12-03: qty 5

## 2022-12-03 MED ORDER — ALUM & MAG HYDROXIDE-SIMETH 200-200-20 MG/5ML PO SUSP: 30.0000 mL | ORAL | Status: DC | PRN

## 2022-12-03 MED ORDER — FLEET ENEMA 7-19 GM/118ML RE ENEM: 1.0000 | ENEMA | Freq: Once | RECTAL | Status: DC | PRN

## 2022-12-03 MED ORDER — FENTANYL CITRATE (PF) 100 MCG/2ML IJ SOLN
INTRAMUSCULAR | Status: AC
  Filled 2022-12-03: qty 2

## 2022-12-03 MED ORDER — SCOPOLAMINE 1 MG/3DAYS TD PT72: 1.0000 | MEDICATED_PATCH | TRANSDERMAL | Status: DC

## 2022-12-03 MED ORDER — CHLORHEXIDINE GLUCONATE 0.12 % MT SOLN
OROMUCOSAL | Status: AC
  Filled 2022-12-03: qty 15

## 2022-12-03 MED ORDER — GABAPENTIN 300 MG PO CAPS
300.0000 mg | ORAL_CAPSULE | Freq: Once | ORAL | Status: AC
  Administered 2022-12-03: 300 mg via ORAL

## 2022-12-03 MED ORDER — ASPIRIN 81 MG PO CHEW
81.0000 mg | CHEWABLE_TABLET | Freq: Two times a day (BID) | ORAL | Status: DC
  Administered 2022-12-03 – 2022-12-04 (×2): 81 mg via ORAL
  Filled 2022-12-03 (×2): qty 1

## 2022-12-03 MED ORDER — LACTATED RINGERS IV SOLN: INTRAVENOUS | Status: DC

## 2022-12-03 MED ORDER — CEFAZOLIN SODIUM-DEXTROSE 2-4 GM/100ML-% IV SOLN
INTRAVENOUS | Status: AC
  Filled 2022-12-03: qty 100

## 2022-12-03 MED ORDER — PHENYLEPHRINE HCL (PRESSORS) 10 MG/ML IV SOLN
INTRAVENOUS | Status: DC | PRN
  Administered 2022-12-03: 40 ug via INTRAVENOUS

## 2022-12-03 MED ORDER — ORAL CARE MOUTH RINSE: 15.0000 mL | Freq: Once | OROMUCOSAL | Status: AC

## 2022-12-03 MED ORDER — ACETAMINOPHEN 10 MG/ML IV SOLN
1000.0000 mg | Freq: Four times a day (QID) | INTRAVENOUS | Status: DC
  Administered 2022-12-03 – 2022-12-04 (×3): 1000 mg via INTRAVENOUS
  Filled 2022-12-03 (×4): qty 100

## 2022-12-03 MED ORDER — CELECOXIB 200 MG PO CAPS
ORAL_CAPSULE | ORAL | Status: AC
  Filled 2022-12-03: qty 2

## 2022-12-03 MED ORDER — ACETAMINOPHEN 325 MG PO TABS: 325.0000 mg | ORAL_TABLET | Freq: Four times a day (QID) | ORAL | Status: DC | PRN

## 2022-12-03 MED ORDER — FERROUS SULFATE 325 (65 FE) MG PO TABS
325.0000 mg | ORAL_TABLET | Freq: Two times a day (BID) | ORAL | Status: DC
  Administered 2022-12-03 – 2022-12-04 (×2): 325 mg via ORAL
  Filled 2022-12-03 (×2): qty 1

## 2022-12-03 MED ORDER — ROCURONIUM BROMIDE 100 MG/10ML IV SOLN
INTRAVENOUS | Status: DC | PRN
  Administered 2022-12-03 (×2): 10 mg via INTRAVENOUS

## 2022-12-03 MED ORDER — LIDOCAINE HCL (CARDIAC) PF 100 MG/5ML IV SOSY
PREFILLED_SYRINGE | INTRAVENOUS | Status: DC | PRN
  Administered 2022-12-03: 100 mg via INTRAVENOUS

## 2022-12-03 MED ORDER — SURGIPHOR WOUND IRRIGATION SYSTEM - OPTIME: TOPICAL | Status: DC | PRN

## 2022-12-03 MED ORDER — ENSURE PRE-SURGERY PO LIQD
296.0000 mL | Freq: Once | ORAL | Status: AC
  Administered 2022-12-03: 296 mL via ORAL
  Filled 2022-12-03: qty 296

## 2022-12-03 MED ORDER — CHLORHEXIDINE GLUCONATE 0.12 % MT SOLN
15.0000 mL | Freq: Once | OROMUCOSAL | Status: AC
  Administered 2022-12-03: 15 mL via OROMUCOSAL

## 2022-12-03 MED ORDER — OXYCODONE HCL 5 MG/5ML PO SOLN: 5.0000 mg | Freq: Once | ORAL | Status: AC | PRN

## 2022-12-03 MED ORDER — DEXAMETHASONE SODIUM PHOSPHATE 10 MG/ML IJ SOLN
INTRAMUSCULAR | Status: AC
  Filled 2022-12-03: qty 1

## 2022-12-03 MED ORDER — MIDAZOLAM HCL 2 MG/2ML IJ SOLN
INTRAMUSCULAR | Status: AC
  Filled 2022-12-03: qty 2

## 2022-12-03 MED ORDER — HYDROMORPHONE HCL 1 MG/ML IJ SOLN
INTRAMUSCULAR | Status: DC | PRN
  Administered 2022-12-03: 1 mg via INTRAVENOUS

## 2022-12-03 MED ORDER — OXYCODONE HCL 5 MG PO TABS
5.0000 mg | ORAL_TABLET | Freq: Once | ORAL | Status: AC | PRN
  Administered 2022-12-03: 5 mg via ORAL

## 2022-12-03 MED ORDER — TRANEXAMIC ACID-NACL 1000-0.7 MG/100ML-% IV SOLN
1000.0000 mg | Freq: Once | INTRAVENOUS | Status: AC
  Administered 2022-12-03: 1000 mg via INTRAVENOUS

## 2022-12-03 MED ORDER — TRANEXAMIC ACID-NACL 1000-0.7 MG/100ML-% IV SOLN
1000.0000 mg | INTRAVENOUS | Status: AC
  Administered 2022-12-03: 1000 mg via INTRAVENOUS

## 2022-12-03 MED ORDER — BISACODYL 10 MG RE SUPP: 10.0000 mg | Freq: Every day | RECTAL | Status: DC | PRN

## 2022-12-03 MED ORDER — CEFAZOLIN SODIUM-DEXTROSE 2-4 GM/100ML-% IV SOLN
2.0000 g | INTRAVENOUS | Status: AC
  Administered 2022-12-03: 2 g via INTRAVENOUS

## 2022-12-03 MED ORDER — KETAMINE HCL 10 MG/ML IJ SOLN
INTRAMUSCULAR | Status: DC | PRN
  Administered 2022-12-03: 50 mg via INTRAVENOUS

## 2022-12-03 MED ORDER — PHENOL 1.4 % MT LIQD: 1.0000 | OROMUCOSAL | Status: DC | PRN

## 2022-12-03 MED ORDER — SODIUM CHLORIDE 0.9 % IV SOLN: INTRAVENOUS | Status: DC

## 2022-12-03 MED ORDER — CELECOXIB 200 MG PO CAPS
400.0000 mg | ORAL_CAPSULE | Freq: Once | ORAL | Status: AC
  Administered 2022-12-03: 400 mg via ORAL

## 2022-12-03 MED ORDER — SUCCINYLCHOLINE CHLORIDE 200 MG/10ML IV SOSY
PREFILLED_SYRINGE | INTRAVENOUS | Status: DC | PRN
  Administered 2022-12-03: 120 mg via INTRAVENOUS

## 2022-12-03 MED ORDER — CELECOXIB 200 MG PO CAPS
200.0000 mg | ORAL_CAPSULE | Freq: Two times a day (BID) | ORAL | Status: DC
  Filled 2022-12-03 (×2): qty 1

## 2022-12-03 MED ORDER — SODIUM CHLORIDE FLUSH 0.9 % IV SOLN
INTRAVENOUS | Status: AC
  Filled 2022-12-03: qty 40

## 2022-12-03 MED ORDER — TRANEXAMIC ACID-NACL 1000-0.7 MG/100ML-% IV SOLN
INTRAVENOUS | Status: AC
  Filled 2022-12-03: qty 100

## 2022-12-03 MED ORDER — DIPHENHYDRAMINE HCL 12.5 MG/5ML PO ELIX: 12.5000 mg | ORAL_SOLUTION | ORAL | Status: DC | PRN

## 2022-12-03 MED ORDER — ONDANSETRON HCL 4 MG/2ML IJ SOLN
INTRAMUSCULAR | Status: AC
  Filled 2022-12-03: qty 2

## 2022-12-03 MED ORDER — ACETAMINOPHEN 10 MG/ML IV SOLN: 1000.0000 mg | Freq: Once | INTRAVENOUS | Status: DC | PRN

## 2022-12-03 MED ORDER — FAMOTIDINE 20 MG PO TABS
20.0000 mg | ORAL_TABLET | Freq: Once | ORAL | Status: AC
  Administered 2022-12-03: 20 mg via ORAL

## 2022-12-03 MED ORDER — SODIUM CHLORIDE (PF) 0.9 % IJ SOLN
INTRAMUSCULAR | Status: DC | PRN
  Administered 2022-12-03: 120 mL

## 2022-12-03 MED ORDER — NEOSTIGMINE METHYLSULFATE 10 MG/10ML IV SOLN
INTRAVENOUS | Status: DC | PRN
  Administered 2022-12-03: 5 mg via INTRAVENOUS

## 2022-12-03 MED ORDER — GABAPENTIN 300 MG PO CAPS
ORAL_CAPSULE | ORAL | Status: AC
  Filled 2022-12-03: qty 1

## 2022-12-03 MED ORDER — MIDAZOLAM HCL 2 MG/2ML IJ SOLN
INTRAMUSCULAR | Status: DC | PRN
  Administered 2022-12-03: 2 mg via INTRAVENOUS

## 2022-12-03 MED ORDER — DEXAMETHASONE SODIUM PHOSPHATE 10 MG/ML IJ SOLN
8.0000 mg | Freq: Once | INTRAMUSCULAR | Status: AC
  Administered 2022-12-03: 8 mg via INTRAVENOUS

## 2022-12-03 MED ORDER — SODIUM CHLORIDE 0.9 % IR SOLN
Status: DC | PRN
  Administered 2022-12-03: 3000 mL

## 2022-12-03 MED ORDER — PROPOFOL 10 MG/ML IV BOLUS
INTRAVENOUS | Status: AC
  Filled 2022-12-03: qty 20

## 2022-12-03 MED ORDER — FENTANYL CITRATE (PF) 100 MCG/2ML IJ SOLN
25.0000 ug | INTRAMUSCULAR | Status: DC | PRN
  Administered 2022-12-03 (×2): 25 ug via INTRAVENOUS

## 2022-12-03 MED ORDER — ONDANSETRON HCL 4 MG/2ML IJ SOLN
4.0000 mg | Freq: Four times a day (QID) | INTRAMUSCULAR | Status: DC | PRN
  Administered 2022-12-03: 4 mg via INTRAVENOUS
  Filled 2022-12-03: qty 2

## 2022-12-03 MED ORDER — ONDANSETRON HCL 4 MG PO TABS: 4.0000 mg | ORAL_TABLET | Freq: Four times a day (QID) | ORAL | Status: DC | PRN

## 2022-12-03 MED ORDER — FENTANYL CITRATE (PF) 100 MCG/2ML IJ SOLN
INTRAMUSCULAR | Status: DC | PRN
  Administered 2022-12-03 (×2): 50 ug via INTRAVENOUS

## 2022-12-03 MED ORDER — METOCLOPRAMIDE HCL 5 MG PO TABS
10.0000 mg | ORAL_TABLET | Freq: Three times a day (TID) | ORAL | Status: DC
  Administered 2022-12-03 (×2): 10 mg via ORAL
  Filled 2022-12-03 (×3): qty 2

## 2022-12-03 MED ORDER — SENNOSIDES-DOCUSATE SODIUM 8.6-50 MG PO TABS
1.0000 | ORAL_TABLET | Freq: Two times a day (BID) | ORAL | Status: DC
  Administered 2022-12-03 – 2022-12-04 (×3): 1 via ORAL
  Filled 2022-12-03 (×3): qty 1

## 2022-12-03 MED ORDER — PROPOFOL 1000 MG/100ML IV EMUL
INTRAVENOUS | Status: AC
  Filled 2022-12-03: qty 100

## 2022-12-03 MED ORDER — TRAMADOL HCL 50 MG PO TABS: 50.0000 mg | ORAL_TABLET | ORAL | Status: DC | PRN

## 2022-12-03 MED ORDER — DEXAMETHASONE SODIUM PHOSPHATE 10 MG/ML IJ SOLN
INTRAMUSCULAR | Status: DC | PRN
  Administered 2022-12-03: 5 mg via INTRAVENOUS

## 2022-12-03 MED ORDER — OXYCODONE HCL 5 MG PO TABS: 10.0000 mg | ORAL_TABLET | ORAL | Status: DC | PRN

## 2022-12-03 MED ORDER — OXYCODONE HCL 5 MG PO TABS
ORAL_TABLET | ORAL | Status: AC
  Filled 2022-12-03: qty 1

## 2022-12-03 MED ORDER — ONDANSETRON HCL 4 MG/2ML IJ SOLN
INTRAMUSCULAR | Status: DC | PRN
  Administered 2022-12-03: 4 mg via INTRAVENOUS

## 2022-12-03 MED ORDER — PSEUDOEPHEDRINE HCL ER 120 MG PO TB12
120.0000 mg | ORAL_TABLET | Freq: Two times a day (BID) | ORAL | Status: DC
  Administered 2022-12-03: 120 mg via ORAL
  Filled 2022-12-03 (×2): qty 1

## 2022-12-03 MED ORDER — BUPIVACAINE HCL (PF) 0.25 % IJ SOLN
INTRAMUSCULAR | Status: AC
  Filled 2022-12-03: qty 60

## 2022-12-03 MED ORDER — LABETALOL HCL 5 MG/ML IV SOLN
INTRAVENOUS | Status: DC | PRN
  Administered 2022-12-03 (×3): 5 mg via INTRAVENOUS

## 2022-12-03 MED ORDER — OXYCODONE HCL 5 MG PO TABS
5.0000 mg | ORAL_TABLET | ORAL | Status: DC | PRN
  Administered 2022-12-03 – 2022-12-04 (×3): 5 mg via ORAL
  Filled 2022-12-03 (×3): qty 1

## 2022-12-03 MED ORDER — FAMOTIDINE 20 MG PO TABS
ORAL_TABLET | ORAL | Status: AC
  Filled 2022-12-03: qty 1

## 2022-12-03 MED ORDER — BUPIVACAINE HCL (PF) 0.5 % IJ SOLN
INTRAMUSCULAR | Status: AC
  Filled 2022-12-03: qty 10

## 2022-12-03 MED ORDER — CHLORHEXIDINE GLUCONATE 4 % EX SOLN
60.0000 mL | Freq: Once | CUTANEOUS | Status: AC
  Administered 2022-12-03: 4 via TOPICAL

## 2022-12-03 MED ORDER — HYDROMORPHONE HCL 1 MG/ML IJ SOLN
INTRAMUSCULAR | Status: AC
  Filled 2022-12-03: qty 1

## 2022-12-03 MED ORDER — BUPIVACAINE LIPOSOME 1.3 % IJ SUSP
INTRAMUSCULAR | Status: AC
  Filled 2022-12-03: qty 20

## 2022-12-03 MED ORDER — HYDROMORPHONE HCL 1 MG/ML IJ SOLN
0.5000 mg | INTRAMUSCULAR | Status: DC | PRN
  Administered 2022-12-03: 1 mg via INTRAVENOUS
  Filled 2022-12-03: qty 1

## 2022-12-03 MED ORDER — CEFAZOLIN SODIUM-DEXTROSE 2-4 GM/100ML-% IV SOLN
2.0000 g | Freq: Four times a day (QID) | INTRAVENOUS | Status: AC
  Administered 2022-12-03 (×2): 2 g via INTRAVENOUS
  Filled 2022-12-03 (×4): qty 100

## 2022-12-03 MED ORDER — ONDANSETRON HCL 4 MG/2ML IJ SOLN
4.0000 mg | Freq: Once | INTRAMUSCULAR | Status: AC | PRN
  Administered 2022-12-03: 4 mg via INTRAVENOUS

## 2022-12-03 MED ORDER — ACETAMINOPHEN 10 MG/ML IV SOLN
INTRAVENOUS | Status: DC | PRN
  Administered 2022-12-03: 1000 mg via INTRAVENOUS

## 2022-12-03 MED ORDER — STERILE WATER FOR IRRIGATION IR SOLN
Status: DC | PRN
  Administered 2022-12-03: 1000 mL

## 2022-12-03 MED ORDER — PANTOPRAZOLE SODIUM 40 MG PO TBEC
40.0000 mg | DELAYED_RELEASE_TABLET | Freq: Two times a day (BID) | ORAL | Status: DC
  Administered 2022-12-03 – 2022-12-04 (×3): 40 mg via ORAL
  Filled 2022-12-03 (×3): qty 1

## 2022-12-03 MED ORDER — MENTHOL 3 MG MT LOZG: 1.0000 | LOZENGE | OROMUCOSAL | Status: DC | PRN

## 2022-12-03 MED ORDER — MAGNESIUM HYDROXIDE 400 MG/5ML PO SUSP
30.0000 mL | Freq: Every day | ORAL | Status: DC
  Administered 2022-12-03: 30 mL via ORAL
  Filled 2022-12-03 (×2): qty 30

## 2022-12-03 MED ORDER — ACETAMINOPHEN 10 MG/ML IV SOLN
INTRAVENOUS | Status: AC
  Filled 2022-12-03: qty 100

## 2022-12-03 SURGICAL SUPPLY — 81 items
ATTUNE MED DOME PAT 38 KNEE (Knees) IMPLANT
ATTUNE PS FEM LT SZ 7 CEM KNEE (Femur) IMPLANT
ATTUNE PSRP INSR SZ7 5 KNEE (Insert) IMPLANT
BASE TIBIAL ROT PLAT SZ 7 KNEE (Knees) IMPLANT
BATTERY INSTRU NAVIGATION (MISCELLANEOUS) ×4 IMPLANT
BLADE SAW 70X12.5 (BLADE) ×1 IMPLANT
BLADE SAW 90X13X1.19 OSCILLAT (BLADE) ×1 IMPLANT
BLADE SAW 90X25X1.19 OSCILLAT (BLADE) ×1 IMPLANT
BONE CEMENT GENTAMICIN (Cement) ×2 IMPLANT
BRUSH SCRUB EZ PLAIN DRY (MISCELLANEOUS) ×1 IMPLANT
BSPLAT TIB 7 CMNT ROT PLAT STR (Knees) ×1 IMPLANT
BTRY SRG DRVR LF (MISCELLANEOUS) ×4
CEMENT BONE GENTAMICIN 40 (Cement) IMPLANT
COOLER POLAR GLACIER W/PUMP (MISCELLANEOUS) ×1 IMPLANT
CUFF TOURN SGL QUICK 24 (TOURNIQUET CUFF)
CUFF TOURN SGL QUICK 30 (TOURNIQUET CUFF) ×1
CUFF TRNQT CYL 24X4X16.5-23 (TOURNIQUET CUFF) IMPLANT
CUFF TRNQT CYL 30X4X21-28X (TOURNIQUET CUFF) IMPLANT
DRAPE 3/4 80X56 (DRAPES) ×1 IMPLANT
DRAPE INCISE IOBAN 66X45 STRL (DRAPES) IMPLANT
DRSG AQUACEL AG ADV 3.5X14 (GAUZE/BANDAGES/DRESSINGS) ×1 IMPLANT
DRSG DERMACEA NONADH 3X8 (GAUZE/BANDAGES/DRESSINGS) ×1 IMPLANT
DRSG MEPILEX SACRM 8.7X9.8 (GAUZE/BANDAGES/DRESSINGS) ×1 IMPLANT
DRSG TEGADERM 4X4.75 (GAUZE/BANDAGES/DRESSINGS) ×1 IMPLANT
DURAPREP 26ML APPLICATOR (WOUND CARE) ×2 IMPLANT
ELECT CAUTERY BLADE 6.4 (BLADE) ×1 IMPLANT
ELECT REM PT RETURN 9FT ADLT (ELECTROSURGICAL) ×1
ELECTRODE REM PT RTRN 9FT ADLT (ELECTROSURGICAL) ×1 IMPLANT
EX-PIN ORTHOLOCK NAV 4X150 (PIN) ×2 IMPLANT
GLOVE BIOGEL M STRL SZ7.5 (GLOVE) ×4 IMPLANT
GLOVE SRG 8 PF TXTR STRL LF DI (GLOVE) ×2 IMPLANT
GLOVE SURG UNDER POLY LF SZ8 (GLOVE) ×2
GOWN STRL REUS W/ TWL LRG LVL3 (GOWN DISPOSABLE) ×1 IMPLANT
GOWN STRL REUS W/ TWL XL LVL3 (GOWN DISPOSABLE) ×1 IMPLANT
GOWN STRL REUS W/TWL LRG LVL3 (GOWN DISPOSABLE) ×1
GOWN STRL REUS W/TWL XL LVL3 (GOWN DISPOSABLE) ×1
GOWN TOGA ZIPPER T7+ PEEL AWAY (MISCELLANEOUS) ×1 IMPLANT
HANDLE YANKAUER SUCT OPEN TIP (MISCELLANEOUS) ×1 IMPLANT
HEMOVAC 400CC 10FR (MISCELLANEOUS) ×1 IMPLANT
HOLDER FOLEY CATH W/STRAP (MISCELLANEOUS) ×1 IMPLANT
HOOD PEEL AWAY T7 (MISCELLANEOUS) ×1 IMPLANT
IV NS IRRIG 3000ML ARTHROMATIC (IV SOLUTION) ×1 IMPLANT
KIT TURNOVER KIT A (KITS) ×1 IMPLANT
KNIFE SCULPS 14X20 (INSTRUMENTS) ×1 IMPLANT
MANIFOLD NEPTUNE II (INSTRUMENTS) ×2 IMPLANT
NDL SPNL 20GX3.5 QUINCKE YW (NEEDLE) ×2 IMPLANT
NEEDLE SPNL 20GX3.5 QUINCKE YW (NEEDLE) ×2
PACK TOTAL KNEE (MISCELLANEOUS) ×1 IMPLANT
PAD ABD DERMACEA PRESS 5X9 (GAUZE/BANDAGES/DRESSINGS) ×2 IMPLANT
PAD ARMBOARD 7.5X6 YLW CONV (MISCELLANEOUS) ×3 IMPLANT
PAD WRAPON POLAR KNEE (MISCELLANEOUS) ×1 IMPLANT
PAD WRAPON POLOR MULTI XL (MISCELLANEOUS) IMPLANT
PENCIL SMOKE EVACUATOR COATED (MISCELLANEOUS) ×1 IMPLANT
PIN DRILL FIX HALF THREAD (BIT) ×2 IMPLANT
PIN DRILL QUICK PACK (PIN) ×2 IMPLANT
PIN FIXATION 1/8DIA X 3INL (PIN) ×1 IMPLANT
PULSAVAC PLUS IRRIG FAN TIP (DISPOSABLE) ×1
SOL PREP PVP 2OZ (MISCELLANEOUS) ×1
SOLUTION IRRIG SURGIPHOR (IV SOLUTION) ×1 IMPLANT
SOLUTION PREP PVP 2OZ (MISCELLANEOUS) ×1 IMPLANT
SPONGE DRAIN TRACH 4X4 STRL 2S (GAUZE/BANDAGES/DRESSINGS) ×1 IMPLANT
STAPLER SKIN PROX 35W (STAPLE) ×1 IMPLANT
STOCKINETTE BIAS CUT 6 980064 (GAUZE/BANDAGES/DRESSINGS) IMPLANT
STOCKINETTE IMPERV 14X48 (MISCELLANEOUS) ×1 IMPLANT
STRAP TIBIA SHORT (MISCELLANEOUS) ×1 IMPLANT
SUCTION TUBE FRAZIER 10FR DISP (SUCTIONS) ×1 IMPLANT
SUT VIC AB 0 CT1 36 (SUTURE) ×1 IMPLANT
SUT VIC AB 1 CT1 36 (SUTURE) ×2 IMPLANT
SUT VIC AB 2-0 CT2 27 (SUTURE) ×1 IMPLANT
SYR 30ML LL (SYRINGE) ×2 IMPLANT
TIBIAL BASE ROT PLAT SZ 7 KNEE (Knees) ×1 IMPLANT
TIP FAN IRRIG PULSAVAC PLUS (DISPOSABLE) ×1 IMPLANT
TOWEL OR 17X26 4PK STRL BLUE (TOWEL DISPOSABLE) IMPLANT
TOWER CARTRIDGE SMART MIX (DISPOSABLE) ×1 IMPLANT
TRAP FLUID SMOKE EVACUATOR (MISCELLANEOUS) ×1 IMPLANT
TRAY FOLEY MTR SLVR 16FR STAT (SET/KITS/TRAYS/PACK) ×1 IMPLANT
TUBING CONNECTING 10 (TUBING) ×2 IMPLANT
WATER STERILE IRR 1000ML POUR (IV SOLUTION) ×1 IMPLANT
WRAP-ON POLOR PAD MULTI XL (MISCELLANEOUS) ×1
WRAPON POLAR PAD KNEE (MISCELLANEOUS)
WRAPON POLOR PAD MULTI XL (MISCELLANEOUS) ×1

## 2022-12-03 NOTE — Anesthesia Postprocedure Evaluation (Signed)
Anesthesia Post Note  Patient: Melissa [name]  Procedure(s) Performed: COMPUTER ASSISTED TOTAL KNEE ARTHROPLASTY (Left: Knee)  Patient location during evaluation: PACU Anesthesia Type: General Level of consciousness: awake and alert, oriented and patient cooperative Pain management: pain level controlled Vital Signs Assessment: post-procedure vital signs reviewed and stable Respiratory status: spontaneous breathing, nonlabored ventilation and respiratory function stable Cardiovascular status: blood pressure returned to baseline and stable Postop Assessment: adequate PO intake Anesthetic complications: no   No notable events documented.   Last Vitals:  Vitals:   12/03/22 1152 12/03/22 1200  BP:  139/65  Pulse: 73 67  Resp: 17 14  Temp:    SpO2: 90% 96%    Last Pain:  Vitals:   12/03/22 1152  PainSc: 5                  Reed Breech

## 2022-12-03 NOTE — Anesthesia Procedure Notes (Signed)
Procedure Name: Intubation Date/Time: 12/03/2022 7:28 AM  Performed by: Berniece Pap, CRNAPre-anesthesia Checklist: Patient identified, Emergency Drugs available, Suction available and Patient being monitored Patient Re-evaluated:Patient Re-evaluated prior to induction Oxygen Delivery Method: Circle system utilized Preoxygenation: Pre-oxygenation with 100% oxygen Induction Type: IV induction Ventilation: Mask ventilation without difficulty Laryngoscope Size: McGraph and 3 Grade View: Grade I Tube type: Oral Tube size: 7.0 mm Number of attempts: 1 Airway Equipment and Method: Stylet and Oral airway Placement Confirmation: ETT inserted through vocal cords under direct vision, positive ETCO2 and breath sounds checked- equal and bilateral Secured at: 21 cm Tube secured with: Tape Dental Injury: Teeth and Oropharynx as per pre-operative assessment

## 2022-12-03 NOTE — Evaluation (Signed)
Physical Therapy Evaluation Patient Details Name: Melissa [name] MRN: [id number] DOB: [birth year] Today's Date: 12/03/2022  History of Present Illness  Pt is a 67 yo F diagnosed with degenerative arthrosis of the left knee and is s/p elective L TKA.  PMH includes: anxiety, depression, spinal stenosis, lumbar fusion, and R TKA.  Clinical Impression  Pt was pleasant and motivated to participate during the session and put forth good effort throughout. Initial BP readings of 138/71 mmHg. Pt maintained high 90's SpO2 throughout with HR in functional range for session activity. Pt presenting with 6/10 NPS constant pain despite being premedicated. Pt requires no physical assist for bed mobility. Pt able to perform STS transfer to RW with Min A and light verbal cues. Able to take 2-3 steps forward with VC for sequencing but ultimately limited by pain and nausea at this time. Pt will benefit from continued PT services upon discharge to safely address deficits listed in patient problem list for decreased caregiver assistance and eventual return to PLOF.          If plan is discharge home, recommend the following: A little help with walking and/or transfers;A little help with bathing/dressing/bathroom;Assist for transportation;Help with stairs or ramp for entrance   Can travel by private vehicle        Equipment Recommendations BSC/3-in-1  Recommendations for Other Services       Functional Status Assessment Patient has had a recent decline in their functional status and demonstrates the ability to make significant improvements in function in a reasonable and predictable amount of time.     Precautions / Restrictions Restrictions Weight Bearing Restrictions: Yes LLE Weight Bearing: Weight bearing as tolerated Other Position/Activity Restrictions: Pt able to perform ind LLE SLR, no KI required      Mobility  Bed Mobility Overal bed mobility: Needs Assistance Bed Mobility: Supine to Sit      Supine to sit: Supervision, HOB elevated     General bed mobility comments: Able to sit up on own from elevated HOB and make adjustments to sitting with legs dangling off EOB    Transfers Overall transfer level: Needs assistance Equipment used: Rolling walker (2 wheels) Transfers: Sit to/from Stand Sit to Stand: Min assist           General transfer comment: Pt able to preform STS with minimal verbal cueing and self selected sequencing    Ambulation/Gait Ambulation/Gait assistance: Min assist Gait Distance (Feet): 2 Feet Assistive device: Rolling walker (2 wheels) Gait Pattern/deviations: Step-to pattern, Decreased step length - right, Decreased step length - left, Decreased stride length, Antalgic Gait velocity: decreased     General Gait Details: Able to take few steps forward with verbal cues for sequencing steps  Stairs            Wheelchair Mobility     Tilt Bed    Modified Rankin (Stroke Patients Only)       Balance Overall balance assessment: Needs assistance Sitting-balance support: Feet supported, No upper extremity supported Sitting balance-Leahy Scale: Good     Standing balance support: Reliant on assistive device for balance, During functional activity, Bilateral upper extremity supported Standing balance-Leahy Scale: Fair Standing balance comment: standing at RW                             Pertinent Vitals/Pain Pain Assessment Pain Assessment: 0-10 Pain Score: 6  Pain Location: L knee Pain Descriptors / Indicators: Aching, Constant, Discomfort,  Grimacing, Guarding Pain Intervention(s): Limited activity within patient's tolerance, Monitored during session, Premedicated before session, Ice applied    Home Living Family/patient expects to be discharged to:: Private residence Living Arrangements: Other relatives Available Help at Discharge: Available 24 hours/day;Family Type of Home: Apartment Home Access: Stairs to  enter Entrance Stairs-Rails: Can reach both Entrance Stairs-Number of Steps: 4   Home Layout: One level Home Equipment: Agricultural consultant (2 wheels);Cane - single point      Prior Function Prior Level of Function : Independent/Modified Independent;Driving             Mobility Comments: Has had to use SPC to preform certain tasks due to L knee pain, but otherwise independent ADLs Comments: independent at baseline     Extremity/Trunk Assessment   Upper Extremity Assessment Upper Extremity Assessment: Overall WFL for tasks assessed    Lower Extremity Assessment Lower Extremity Assessment: Generalized weakness;LLE deficits/detail LLE Deficits / Details: S/P L TKA, BLE ankle strength AROM and sensation to l/t all grossly WNL LLE Sensation: WNL       Communication   Communication Communication: No apparent difficulties  Cognition Arousal: Alert Behavior During Therapy: WFL for tasks assessed/performed                                            General Comments      Exercises Total Joint Exercises Ankle Circles/Pumps: 10 reps, Both, Strengthening, AROM Quad Sets: 5 reps, Strengthening, Left, AROM Gluteal Sets: Both, 10 reps, AROM Straight Leg Raises: AROM, Strengthening, Left, 5 reps Long Arc Quad: 5 reps, AROM, AAROM, Strengthening, Left Knee Flexion: AROM, Left, 5 reps Goniometric ROM: LLE AROM 5-66 deg L knee positioning education provided to promote L knee ext PROM, HEP education per handout   Assessment/Plan    PT Assessment Patient needs continued PT services  PT Problem List Decreased strength;Decreased range of motion;Decreased activity tolerance;Decreased balance;Decreased mobility;Decreased coordination;Decreased safety awareness;Pain       PT Treatment Interventions DME instruction;Balance training;Gait training;Stair training;Functional mobility training;Therapeutic activities;Therapeutic exercise    PT Goals (Current goals can be  found in the Care Plan section)  Acute Rehab PT Goals Patient Stated Goal: walk again PT Goal Formulation: With patient Time For Goal Achievement: 12/16/22 Potential to Achieve Goals: Good    Frequency BID     Co-evaluation               AM-PAC PT "6 Clicks" Mobility  Outcome Measure Help needed turning from your back to your side while in a flat bed without using bedrails?: A Little Help needed moving from lying on your back to sitting on the side of a flat bed without using bedrails?: A Little Help needed moving to and from a bed to a chair (including a wheelchair)?: A Little Help needed standing up from a chair using your arms (e.g., wheelchair or bedside chair)?: A Little Help needed to walk in hospital room?: A Little Help needed climbing 3-5 steps with a railing? : A Lot 6 Click Score: 17    End of Session Equipment Utilized During Treatment: Gait belt Activity Tolerance: Patient limited by pain;Other (comment) (Limited by nausea) Patient left: with call bell/phone within reach;with SCD's reapplied;Other (comment);in chair;with chair alarm set (Polar Care applied) Nurse Communication: Mobility status PT Visit Diagnosis: Muscle weakness (generalized) (M62.81);Pain;Other abnormalities of gait and mobility (R26.89) Pain - Right/Left: Left Pain -  part of body: Knee    Time: 1620-1710 (and [phone] with break secondary to nausea) PT Time Calculation (min) (ACUTE ONLY): 50 min   Charges:                 Cecile Sheerer, SPT 12/03/22, 5:45 PM

## 2022-12-03 NOTE — Plan of Care (Signed)

## 2022-12-03 NOTE — Transfer of Care (Signed)
Immediate Anesthesia Transfer of Care Note  Patient: Melissa [name]  Procedure(s) Performed: COMPUTER ASSISTED TOTAL KNEE ARTHROPLASTY (Left: Knee)  Patient Location: PACU  Anesthesia Type:General  Level of Consciousness: awake and alert   Airway & Oxygen Therapy: Patient Spontanous Breathing and Patient connected to nasal cannula oxygen  Post-op Assessment: Report given to RN and Post -op Vital signs reviewed and stable  Post vital signs: Reviewed and stable  Last Vitals:  Vitals Value Taken Time  BP 145/79 12/03/22 1120  Temp 36.8 C 12/03/22 1117  Pulse 76 12/03/22 1123  Resp 15 12/03/22 1123  SpO2 98 % 12/03/22 1123  Vitals shown include unfiled device data.  Last Pain:  Vitals:   12/03/22 0622  PainSc: 0-No pain         Complications: No notable events documented.

## 2022-12-03 NOTE — Anesthesia Preprocedure Evaluation (Addendum)
Anesthesia Evaluation  Patient identified by MRN, date of birth, ID band Patient awake    Reviewed: Allergy & Precautions, NPO status , Patient's Chart, lab work & pertinent test results  History of Anesthesia Complications (+) PONV and history of anesthetic complications  Airway Mallampati: I   Neck ROM: Full    Dental  (+) Upper Dentures, Lower Dentures   Pulmonary former smoker (quit 2006)   Pulmonary exam normal breath sounds clear to auscultation       Cardiovascular hypertension, Normal cardiovascular exam Rhythm:Regular Rate:Normal  ECG 11/26/22: Normal sinus rhythm Inferior infarct , age undetermined Possible Anterior infarct (cited on or before 06-Mar-2021)   Neuro/Psych  Headaches PSYCHIATRIC DISORDERS Anxiety Depression    Chronic pain    GI/Hepatic hiatal hernia,GERD  ,,  Endo/Other  Prediabetes; class 3 obesity  Renal/GU negative Renal ROS     Musculoskeletal  (+) Arthritis ,    Abdominal   Peds  Hematology negative hematology ROS (+)   Anesthesia Other Findings   Reproductive/Obstetrics                             Anesthesia Physical Anesthesia Plan  ASA: 3  Anesthesia Plan: General   Post-op Pain Management:    Induction: Intravenous  PONV Risk Score and Plan: 4 or greater and Ondansetron, Dexamethasone, Treatment may vary due to age or medical condition and Scopolamine patch - Pre-op  Airway Management Planned: Oral ETT  Additional Equipment:   Intra-op Plan:   Post-operative Plan: Extubation in OR  Informed Consent: I have reviewed the patients History and Physical, chart, labs and discussed the procedure including the risks, benefits and alternatives for the proposed anesthesia with the patient or authorized representative who has indicated his/her understanding and acceptance.     Dental advisory given  Plan Discussed with: CRNA  Anesthesia Plan  Comments: (Patient refuses spinal; desired GETA. Patient consented for risks of anesthesia including but not limited to:  - adverse reactions to medications - damage to eyes, teeth, lips or other oral mucosa - nerve damage due to positioning  - sore throat or hoarseness - damage to heart, brain, nerves, lungs, other parts of body or loss of life  Informed patient about role of CRNA in peri- and intra-operative care.  Patient voiced understanding.)        Anesthesia Quick Evaluation

## 2022-12-03 NOTE — Op Note (Signed)
OPERATIVE NOTE  DATE OF SURGERY:  12/03/2022  PATIENT NAME:  Mikaylie Glennon   DOB: [birth year]  MRN: [id number]  PRE-OPERATIVE DIAGNOSIS: Degenerative arthrosis of the left knee, primary  POST-OPERATIVE DIAGNOSIS:  Same  PROCEDURE:  Left total knee arthroplasty using computer-assisted navigation  SURGEON:  Jena Gauss. M.D.  ANESTHESIA: general  ESTIMATED BLOOD LOSS: 50 mL  FLUIDS REPLACED: 1500 mL of crystalloid  TOURNIQUET TIME: 93 minutes  DRAINS: 2 medium Hemovac drains  SOFT TISSUE RELEASES: Anterior cruciate ligament, posterior cruciate ligament, deep medial collateral ligament, patellofemoral ligament  IMPLANTS UTILIZED: DePuy Attune size 7 posterior stabilized femoral component (cemented), size 7 rotating platform tibial component (cemented), 38 mm medialized dome patella (cemented), and a 5 mm stabilized rotating platform polyethylene insert.  INDICATIONS FOR SURGERY: Sameeha Wichmann is a 67 y.o. year old female with a long history of progressive knee pain. X-rays demonstrated severe degenerative changes in tricompartmental fashion. The patient had not seen any significant improvement despite conservative nonsurgical intervention. After discussion of the risks and benefits of surgical intervention, the patient expressed understanding of the risks benefits and agree with plans for total knee arthroplasty.   The risks, benefits, and alternatives were discussed at length including but not limited to the risks of infection, bleeding, nerve injury, stiffness, blood clots, the need for revision surgery, cardiopulmonary complications, among others, and they were willing to proceed.  PROCEDURE IN DETAIL: The patient was brought into the operating room and, after adequate general anesthesia was achieved, a tourniquet was placed on the patient's upper thigh. The patient's knee and leg were cleaned and prepped with alcohol and DuraPrep and draped in the usual sterile fashion. A  "timeout" was performed as per usual protocol. The lower extremity was exsanguinated using an Esmarch, and the tourniquet was inflated to 300 mmHg. An anterior longitudinal incision was made followed by a standard mid vastus approach. The deep fibers of the medial collateral ligament were elevated in a subperiosteal fashion off of the medial flare of the tibia so as to maintain a continuous soft tissue sleeve. The patella was subluxed laterally and the patellofemoral ligament was incised. Inspection of the knee demonstrated severe degenerative changes with full-thickness loss of articular cartilage. Osteophytes were debrided using a rongeur. Anterior and posterior cruciate ligaments were excised. Two 4.0 mm Schanz pins were inserted in the femur and into the tibia for attachment of the array of trackers used for computer-assisted navigation. Hip center was identified using a circumduction technique. Distal landmarks were mapped using the computer. The distal femur and proximal tibia were mapped using the computer. The distal femoral cutting guide was positioned using computer-assisted navigation so as to achieve a 5 distal valgus cut. The femur was sized and it was felt that a size 7 femoral component was appropriate. A size 7 femoral cutting guide was positioned and the anterior cut was performed and verified using the computer. This was followed by completion of the posterior and chamfer cuts. Femoral cutting guide for the central box was then positioned in the center box cut was performed.  Attention was then directed to the proximal tibia. Medial and lateral menisci were excised. The extramedullary tibial cutting guide was positioned using computer-assisted navigation so as to achieve a 0 varus-valgus alignment and 3 posterior slope. The cut was performed and verified using the computer. The proximal tibia was sized and it was felt that a size 7 tibial tray was appropriate. Tibial and femoral trials were  inserted followed  by insertion of a 5 mm polyethylene insert. This allowed for excellent mediolateral soft tissue balancing both in flexion and in full extension. Finally, the patella was cut and prepared so as to accommodate a 38 mm medialized dome patella. A patella trial was placed and the knee was placed through a range of motion with excellent patellar tracking appreciated. The femoral trial was removed after debridement of posterior osteophytes. The central post-hole for the tibial component was reamed followed by insertion of a keel punch. Tibial trials were then removed. Cut surfaces of bone were irrigated with copious amounts of normal saline using pulsatile lavage and then suctioned dry. Polymethylmethacrylate cement with gentamicin was prepared in the usual fashion using a vacuum mixer. Cement was applied to the cut surface of the proximal tibia as well as along the undersurface of a size 7 rotating platform tibial component. Tibial component was positioned and impacted into place. Excess cement was removed using Personal assistant. Cement was then applied to the cut surfaces of the femur as well as along the posterior flanges of the size 7 femoral component. The femoral component was positioned and impacted into place. Excess cement was removed using Personal assistant. A 5 mm polyethylene trial was inserted and the knee was brought into full extension with steady axial compression applied. Finally, cement was applied to the backside of a 38 mm medialized dome patella and the patellar component was positioned and patellar clamp applied. Excess cement was removed using Personal assistant. After adequate curing of the cement, the tourniquet was deflated after a total tourniquet time of 93 minutes. Hemostasis was achieved using electrocautery. The knee was irrigated with copious amounts of normal saline using pulsatile lavage followed by 450 ml of Surgiphor and then suctioned dry. 20 mL of 1.3% Exparel and 60 mL of  0.25% Marcaine in 40 mL of normal saline was injected along the posterior capsule, medial and lateral gutters, and along the arthrotomy site. A 5 mm stabilized rotating platform polyethylene insert was inserted and the knee was placed through a range of motion with excellent mediolateral soft tissue balancing appreciated and excellent patellar tracking noted. 2 medium drains were placed in the wound bed and brought out through separate stab incisions. The medial parapatellar portion of the incision was reapproximated using interrupted sutures of #1 Vicryl. Subcutaneous tissue was approximated in layers using first #0 Vicryl followed #2-0 Vicryl. The skin was approximated with skin staples. A sterile dressing was applied.  The patient tolerated the procedure well and was transported to the recovery room in stable condition.     P. Angie Fava., M.D.

## 2022-12-03 NOTE — Interval H&P Note (Signed)
History and Physical Interval Note:  12/03/2022 6:23 AM  Melissa [name]  has presented today for surgery, with the diagnosis of PRIMARY OSTEOARTHRITIS OF LEFT KNEE..  The various methods of treatment have been discussed with the patient and family. After consideration of risks, benefits and other options for treatment, the patient has consented to  Procedure(s): COMPUTER ASSISTED TOTAL KNEE ARTHROPLASTY - RNFA (Left) as a surgical intervention.  The patient's history has been reviewed, patient examined, no change in status, stable for surgery.  I have reviewed the patient's chart and labs.  Questions were answered to the patient's satisfaction.      P 

## 2022-12-04 DIAGNOSIS — M1712 Unilateral primary osteoarthritis, left knee: Secondary | ICD-10-CM | POA: Diagnosis not present

## 2022-12-04 MED ORDER — ASPIRIN 81 MG PO CHEW: 81.0000 mg | CHEWABLE_TABLET | Freq: Two times a day (BID) | ORAL | 0 refills | Status: AC

## 2022-12-04 MED ORDER — OXYCODONE HCL 5 MG PO TABS: 5.0000 mg | ORAL_TABLET | ORAL | 0 refills | Status: DC | PRN

## 2022-12-04 MED ORDER — ACETAMINOPHEN 325 MG PO TABS: 325.0000 mg | ORAL_TABLET | Freq: Four times a day (QID) | ORAL | Status: DC | PRN

## 2022-12-04 MED ORDER — CELECOXIB 200 MG PO CAPS: 200.0000 mg | ORAL_CAPSULE | Freq: Two times a day (BID) | ORAL | 0 refills | Status: AC

## 2022-12-04 MED ORDER — ONDANSETRON HCL 4 MG PO TABS: 4.0000 mg | ORAL_TABLET | Freq: Four times a day (QID) | ORAL | 0 refills | Status: DC | PRN

## 2022-12-04 MED ORDER — TRAMADOL HCL 50 MG PO TABS: 50.0000 mg | ORAL_TABLET | ORAL | 0 refills | Status: DC | PRN

## 2022-12-04 MED ORDER — SENNOSIDES-DOCUSATE SODIUM 8.6-50 MG PO TABS: 1.0000 | ORAL_TABLET | Freq: Two times a day (BID) | ORAL | 0 refills | Status: DC

## 2022-12-04 NOTE — TOC Initial Note (Signed)
Transition of Care [facility]) - Initial/Assessment Note    Patient Details  Name: Melissa [name] MRN: [id number] Date of Birth: June 13, 1955  [facility] CM/SW Contact:    Bing Quarry, RN Phone Number: 12/04/2022, 11:25 AM  Clinical Narrative: 8/10: Patient ready for discharge today with [facility] services via [facility]. Has most needed DME ordered from prior knee surgery except bariatric 3:1 which she received in May 2021 and insurance will not cover and patient decline delivery from [employer] for $145. Discussed retail and used medical equipment option with patient with niece present in room. Patient feels she will do fine as she did last time but understands order is in and she can contact [employer] if she changes her mind when she gets home or let [facility] PT know. Confirmed with [facility] discharge today.  Patient has PCP, no issues obtaining or paying for medications, niece will provider transport home and to appointments as patient sold her car a few months ago. She lives alone in ground floor apartment with 4 steps and railing on both sides. Patient states PT cleared for steps. Discussed other safety issues such a changes in surface levels, rugs, bathroom safety. Niece will stay with patient first night home after discharge. Patient had no other concerns or questions regarding discharge plan, and follow up instructions. Ready for discharge from [facility] point of view.    Gabriel Cirri MSN RN CM  Transitions of Care Department [facility] [phone] Weekends Only    Barriers to Discharge: No Barriers Identified, Barriers Resolved   Patient Goals and CMS Choice   CMS Medicare.gov Compare Post Acute Care list provided to:: Patient Choice offered to / list presented to : Patient      Expected Discharge Plan and Services         Expected Discharge Date: 12/04/22               DME Arranged: Other see comment (Patient has RW, CANE, ICE MACHINE AT HOME. Received bariatric 3:1 for [employer] reasons May 2021, but no longer has it, declined [employer] to deliver with a $145 charge as Medicare will not cover it but every 5 years. Patient notified.) DME Agency: Other - Comment (Patient has RW, CANE, ICE MACHINE AT HOME. Received bariatric 3:1 for [employer] reasons May 2021, but no longer has it, declined [employer] to deliver with a $145 charge as Medicare will not cover it but every 5 years. Patient notified.) Date DME Agency Contacted: 12/04/22 Time DME Agency Contacted: 1119 Representative spoke with at DME Agency: Laurelyn Sickle HH Arranged: PT HH Agency: [facility] Date [facility] Agency Contacted: 12/04/22 Time HH Agency Contacted: 1119 Representative spoke with at [facility] Agency: Laurelyn Sickle  Prior Living Arrangements/Services                       Activities of Daily Living      Permission Sought/Granted                  Emotional Assessment              Admission diagnosis:  Total knee replacement status [Z96.659] Patient Active Problem List   Diagnosis Date Noted   Total knee replacement status 12/03/2022   Primary osteoarthritis of left knee 06/27/2022   Status post total right knee replacement 03/24/2022   Lumbar radiculopathy 09/12/2019   Anterolisthesis 09/12/2019   Recurrent major depressive disorder, in remission (HCC) 01/23/2019   BMI 40.0-44.9,  adult (HCC) 05/20/2017   GAD (generalized anxiety disorder) 01/18/2017   Essential hypertension 10/21/2016   PCP:  Lauro Regulus, MD Pharmacy:   [facility] - [place], [place] - [address] [place] [address] Phone: [phone] Fax: [phone]     Social Determinants of Health (SDOH) Social History: SDOH Screenings   Food Insecurity: Food Insecurity Present (08/13/2022)   Received from [facility], [facility]  Housing: Low Risk  (03/24/2022)  Transportation Needs: No Transportation Needs (08/13/2022)   Received from  [facility], [facility], [facility]  Utilities: Not At Risk (08/13/2022)   Received from [facility], [facility]  Financial Resource Strain: Low Risk  (08/13/2022)   Received from [facility], [facility]  Tobacco Use: Medium Risk (12/03/2022)   SDOH Interventions: Food Insecurity Interventions: Intervention Not Indicated, Other (Comment) (Was identified at another facility in April 2024 so will add resources to AVS. Not screened this admission in EMR.)   Readmission Risk Interventions     No data to display

## 2022-12-04 NOTE — Plan of Care (Signed)
  Problem: Activity: Goal: Ability to avoid complications of mobility impairment will improve Outcome: Progressing Goal: Range of joint motion will improve Outcome: Progressing   Problem: Clinical Measurements: Goal: Postoperative complications will be avoided or minimized Outcome: Progressing   Problem: Pain Management: Goal: Pain level will decrease with appropriate interventions Outcome: Progressing   Problem: Skin Integrity: Goal: Will show signs of wound healing Outcome: Progressing   

## 2022-12-04 NOTE — Progress Notes (Addendum)
   Subjective: 1 Day Post-Op Procedure(s) (LRB): COMPUTER ASSISTED TOTAL KNEE ARTHROPLASTY (Left) Patient reports pain as mild.   Patient is well, and has had no acute complaints or problems Denies any CP, SOB, ABD pain. We will continue therapy today.  Plan is to go Home after hospital stay.  Objective: Vital signs in last 24 hours: Temp:  [97.3 F (36.3 C)-98.4 F (36.9 C)] 98.3 F (36.8 C) (08/10 0338) Pulse Rate:  [66-83] 72 (08/10 0338) Resp:  [13-21] 20 (08/10 0338) BP: (118-158)/(46-120) 123/58 (08/10 0338) SpO2:  [90 %-100 %] 96 % (08/10 0338)  Intake/Output from previous day: 08/09 0701 - 08/10 0700 In: 1800 [I.V.:1600; IV Piggyback:200] Out: 2500 [Urine:2100; Emesis/NG output:50; Drains:300; Blood:50] Intake/Output this shift: No intake/output data recorded.  No results for input(s): "HGB" in the last 72 hours. No results for input(s): "WBC", "RBC", "HCT", "PLT" in the last 72 hours. No results for input(s): "NA", "K", "CL", "CO2", "BUN", "CREATININE", "GLUCOSE", "CALCIUM" in the last 72 hours. No results for input(s): "LABPT", "INR" in the last 72 hours.  EXAM General - Patient is Alert, Appropriate, and Oriented Extremity - Neurovascular intact Sensation intact distally Intact pulses distally Dorsiflexion/Plantar flexion intact No cellulitis present Compartment soft Dressing - dressing C/D/I and no drainage, Hemovac removed Motor Function - intact, moving foot and toes well on exam.   Past Medical History:  Diagnosis Date   Anterolisthesis    Anxiety    Arthritis    Depression    Difficult extubation 03/24/2022   a.) unexpected per anesthesia notes; declined neuraxial anesthetic course   DOE (dyspnea on exertion)    GERD (gastroesophageal reflux disease)    Headache    cervical   Hiatal hernia    HTN (hypertension)    Lumbar radiculopathy    PONV (postoperative nausea and vomiting)    Precancerous skin lesion (nose)    Prediabetes    Spinal  stenosis     Assessment/Plan:   1 Day Post-Op Procedure(s) (LRB): COMPUTER ASSISTED TOTAL KNEE ARTHROPLASTY (Left) Principal Problem:   Total knee replacement status  Estimated body mass index is 40.85 kg/m as calculated from the following:   Height as of this encounter: 5\' 6"  (1.676 m).   Weight as of this encounter: 114.8 kg. Advance diet Up with therapy Pain well-controlled Vital signs are stable Care management to assist with discharge to home with home health PT today pending safe completion of PT goals.   DVT Prophylaxis - Aspirin, TED hose, and SCDs Weight-Bearing as tolerated to left leg   T. Cranston Neighbor, PA-C [facility] 12/04/2022, 7:39 AM   Patient seen and examined, agree with above plan.  The patient is doing well status post left total knee arthroplasty, no concerns at this time.  Pain is controlled.  Discussed DVT prophylaxis, pain medication use, and safe transition to home.  All questions answered the patient agrees with above plan will go home after clears PT.   Reinaldo Berber MD

## 2022-12-04 NOTE — Evaluation (Signed)
Occupational Therapy Evaluation Patient Details Name: Melissa [name] MRN: [id number] DOB: 1955-12-13 Today's Date: 12/04/2022   History of Present Illness Pt is a 67 yo F diagnosed with degenerative arthrosis of the left knee and is s/p elective L TKA.  PMH includes: anxiety, depression, spinal stenosis, lumbar fusion, and R TKA.   Clinical Impression   Pt agreeable to OT evaluation.  Pt's niece, Karoline Caldwell present in room during eval and pt agreeable for Angie to participate in OT eval/poc.  Angie will be staying with pt and caring for her as needed upon d/c.  OT reviewed AE recommendations for home, including benefits of a tub bar, vs a transfer tub bench, as pt does not have any DME at home and feels she needs additional support for the shower.  Pt was advised on options to obtain, as 3in1 will likely not fit into her tub/shower combo.  Pt was also encouraged to ask her landlord if they could install grab bars in the shower and at the toilet.  Pt will plan to inquire.  Pt will benefit from bariatric 3in1 for over her standard toilet at home.  Pt was able to perform toilet transfer in her hospital room this date with use of grab bar, which was effortful, so pt was agreeable to 3in1 for home.  Will continue to follow in the acute setting to maximize indep and safety with ADLs.      If plan is discharge home, recommend the following: A little help with walking and/or transfers;A little help with bathing/dressing/bathroom;Assistance with cooking/housework;Assist for transportation;Help with stairs or ramp for entrance    Functional Status Assessment  Patient has had a recent decline in their functional status and demonstrates the ability to make significant improvements in function in a reasonable and predictable amount of time.  Equipment Recommendations  BSC/3in1 (needs bariatric 3in1)    Recommendations for Other Services       Precautions / Restrictions Precautions Precautions:  Fall Restrictions Weight Bearing Restrictions: Yes LLE Weight Bearing: Weight bearing as tolerated      Mobility Bed Mobility               General bed mobility comments: NT this date as pt was received and left in chair at end of session Patient Response: Cooperative  Transfers Overall transfer level: Needs assistance Equipment used: Rolling walker (2 wheels) Transfers: Sit to/from Stand Sit to Stand: Supervision           General transfer comment: STS from low EOB with close supv and RW      Balance Overall balance assessment: Needs assistance Sitting-balance support: Feet supported, No upper extremity supported Sitting balance-Leahy Scale: Good     Standing balance support: Bilateral upper extremity supported, During functional activity, Reliant on assistive device for balance Standing balance-Leahy Scale: Fair Standing balance comment: standing at RW; able to release hands from support surface to complete hand hygiene without LOB                           ADL either performed or assessed with clinical judgement   ADL Overall ADL's : Needs assistance/impaired     Grooming: Wash/dry hands;Supervision/safety;Standing               Lower Body Dressing: Minimal assistance;Sit to/from stand   Toilet Transfer: Investment banker, operational Details (indicate cue type and reason): Pt has no grab bar at home so will need bariatric 3in1 Toileting-  Clothing Manipulation and Hygiene: Set up       Functional mobility during ADLs: Supervision/safety;Rolling walker (2 wheels)       Vision Patient Visual Report: No change from baseline                         Pertinent Vitals/Pain Pain Assessment Pain Assessment: 0-10 Pain Score: 4  Pain Location: L knee Pain Descriptors / Indicators: Discomfort Pain Intervention(s): Limited activity within patient's tolerance, Monitored during session, Ice applied      Extremity/Trunk Assessment Upper Extremity Assessment Upper Extremity Assessment: Overall WFL for tasks assessed   Lower Extremity Assessment Lower Extremity Assessment: Defer to PT evaluation;LLE deficits/detail LLE Deficits / Details: S/P L TKA       Communication Communication Communication: No apparent difficulties   Cognition Arousal: Alert Behavior During Therapy: WFL for tasks assessed/performed Overall Cognitive Status: Within Functional Limits for tasks assessed                                             Exercises Other Exercises Other Exercises: Educ on OT role, goals, poc   Shoulder Instructions      Home Living Family/patient expects to be discharged to:: Private residence Living Arrangements: Other relatives Available Help at Discharge: Available 24 hours/day;Family (Niece, Angie who is a former CNA will be staying with the pt and assisting as needed) Type of Home: Apartment Home Access: Stairs to enter [employer] of Steps: 4 Entrance Stairs-Rails: Can reach both Home Layout: One level     Bathroom Shower/Tub: Chief Strategy Officer: Standard Bathroom Accessibility: Yes   Home Equipment: Agricultural consultant (2 wheels);Cane - single point          Prior Functioning/Environment Prior Level of Function : Independent/Modified Independent;Driving               ADLs Comments: independent at baseline        OT Problem List: Decreased knowledge of use of DME or AE;Impaired balance (sitting and/or standing);Pain      OT Treatment/Interventions: Self-care/ADL training;DME and/or AE instruction    OT Goals(Current goals can be found in the care plan section) Acute Rehab OT Goals Patient Stated Goal: Home with family assist OT Goal Formulation: With patient/family Time For Goal Achievement: 12/18/22 Potential to Achieve Goals: Good ADL Goals Pt Will Transfer to Toilet: with modified  independence;ambulating;bedside commode  OT Frequency: Min 1X/week    Co-evaluation              AM-PAC OT "6 Clicks" Daily Activity     Outcome Measure Help from another person eating meals?: None Help from another person taking care of personal grooming?: A Little Help from another person toileting, which includes using toliet, bedpan, or urinal?: A Little Help from another person bathing (including washing, rinsing, drying)?: A Little Help from another person to put on and taking off regular upper body clothing?: None Help from another person to put on and taking off regular lower body clothing?: A Little 6 Click Score: 20   End of Session Equipment Utilized During Treatment: Gait belt;Rolling walker (2 wheels) Nurse Communication: Other (comment) (DME needs upon d/c)  Activity Tolerance: Patient tolerated treatment well Patient left: in chair;with call bell/phone within reach;with chair alarm set;with family/visitor present  OT Visit Diagnosis: Other abnormalities of gait and mobility (  R26.89);Muscle weakness (generalized) (M62.81)                Time: 2952-8413 OT Time Calculation (min): 26 min Charges:  OT General Charges $OT Visit: 1 Visit OT Evaluation $OT Eval Moderate Complexity: 1 Mod OT Treatments $Self Care/Home Management : 8-22 mins  Danelle Earthly, MS, OTR/L   Otis Dials 12/04/2022, 11:45 AM

## 2022-12-04 NOTE — Progress Notes (Signed)
Physical Therapy Treatment Patient Details Name: Melissa [name] MRN: [id number] DOB: Aug 22, 1955 Today's Date: 12/04/2022   History of Present Illness Pt is a 67 yo F diagnosed with degenerative arthrosis of the left knee and is s/p elective L TKA.  PMH includes: anxiety, depression, spinal stenosis, lumbar fusion, and R TKA.    PT Comments  Pt seen for PT tx with pt pleasant & agreeable. Pt reporting pain has improved since yesterday. Pt is able to progress to ambulating increased distances with RW & CGA with cuing for upright posture & to ambulate closer to RW. Pt negotiates 4 steps with B rails & CGA with cuing for compensatory pattern. Pt denies concerns re: d/c home today.     If plan is discharge home, recommend the following: A little help with walking and/or transfers;A little help with bathing/dressing/bathroom;Assist for transportation;Help with stairs or ramp for entrance   Can travel by private vehicle        Equipment Recommendations  BSC/3in1    Recommendations for Other Services       Precautions / Restrictions Precautions Precautions: Fall Restrictions Weight Bearing Restrictions: Yes LLE Weight Bearing: Weight bearing as tolerated     Mobility  Bed Mobility Overal bed mobility: Needs Assistance Bed Mobility: Supine to Sit     Supine to sit: Supervision, HOB elevated     General bed mobility comments: uses UE to move LLE to EOB PRN    Transfers Overall transfer level: Needs assistance Equipment used: Rolling walker (2 wheels) Transfers: Sit to/from Stand Sit to Stand: Contact guard assist, Supervision           General transfer comment: STS from low EOB    Ambulation/Gait Ambulation/Gait assistance: Contact guard assist Gait Distance (Feet): 300 Feet Assistive device: Rolling walker (2 wheels) Gait Pattern/deviations: Decreased step length - right, Decreased step length - left, Decreased stride length, Step-through pattern, Step-to  pattern Gait velocity: decreased     General Gait Details: Step through fade to step to pattern as pt fatigues.   Stairs Stairs: Yes Stairs assistance: Contact guard assist Stair Management: Two rails, Step to pattern Number of Stairs: 4 (6") General stair comments: Verbal cuing re: compensatory pattern.   Wheelchair Mobility     Tilt Bed    Modified Rankin (Stroke Patients Only)       Balance Overall balance assessment: Needs assistance Sitting-balance support: Feet supported, No upper extremity supported Sitting balance-Leahy Scale: Good     Standing balance support: Bilateral upper extremity supported, During functional activity, Reliant on assistive device for balance Standing balance-Leahy Scale: Fair                              Cognition Arousal: Alert Behavior During Therapy: WFL for tasks assessed/performed                                            Exercises      General Comments        Pertinent Vitals/Pain Pain Assessment Pain Assessment: 0-10 Pain Score:  (3/10 at beginning of session, increased to 4/10 by end) Pain Location: L knee Pain Descriptors / Indicators: Discomfort Pain Intervention(s): Monitored during session, Repositioned (polar care at beginning & end of session)    Home Living  Prior Function            PT Goals (current goals can now be found in the care plan section) Acute Rehab PT Goals Patient Stated Goal: walk again PT Goal Formulation: With patient Time For Goal Achievement: 12/16/22 Potential to Achieve Goals: Good Progress towards PT goals: Progressing toward goals    Frequency    BID      PT Plan      Co-evaluation              AM-PAC PT "6 Clicks" Mobility   Outcome Measure  Help needed turning from your back to your side while in a flat bed without using bedrails?: None Help needed moving from lying on your back to sitting  on the side of a flat bed without using bedrails?: None Help needed moving to and from a bed to a chair (including a wheelchair)?: A Little Help needed standing up from a chair using your arms (e.g., wheelchair or bedside chair)?: A Little Help needed to walk in hospital room?: A Little Help needed climbing 3-5 steps with a railing? : A Little 6 Click Score: 20    End of Session Equipment Utilized During Treatment: Gait belt Activity Tolerance: Patient tolerated treatment well Patient left: in chair;with chair alarm set;with call bell/phone within reach (polar care donned on LLE)   PT Visit Diagnosis: Muscle weakness (generalized) (M62.81);Pain;Other abnormalities of gait and mobility (R26.89) Pain - Right/Left: Left Pain - part of body: Knee     Time: 1191-4782 PT Time Calculation (min) (ACUTE ONLY): 21 min  Charges:    $Gait Training: 8-22 mins PT General Charges $$ ACUTE PT VISIT: 1 Visit                     Aleda Grana, PT, DPT 12/04/22, 9:15 AM    Sandi Mariscal 12/04/2022, 9:14 AM

## 2022-12-04 NOTE — Plan of Care (Signed)

## 2022-12-04 NOTE — Discharge Summary (Signed)
Physician Discharge Summary  Patient ID: Melissa [name] MRN: [id number] DOB/AGE: [birth year] 67 y.o.  Admit date: 12/03/2022 Discharge date: 12/04/2022  Admission Diagnoses:  Total knee replacement status [Z96.659]   Discharge Diagnoses: Patient Active Problem List   Diagnosis Date Noted   Total knee replacement status 12/03/2022   Primary osteoarthritis of left knee 06/27/2022   Status post total right knee replacement 03/24/2022   Lumbar radiculopathy 09/12/2019   Anterolisthesis 09/12/2019   Recurrent major depressive disorder, in remission (HCC) 01/23/2019   BMI 40.0-44.9, adult (HCC) 05/20/2017   GAD (generalized anxiety disorder) 01/18/2017   Essential hypertension 10/21/2016    Past Medical History:  Diagnosis Date   Anterolisthesis    Anxiety    Arthritis    Depression    Difficult extubation 03/24/2022   a.) unexpected per anesthesia notes; declined neuraxial anesthetic course   DOE (dyspnea on exertion)    GERD (gastroesophageal reflux disease)    Headache    cervical   Hiatal hernia    HTN (hypertension)    Lumbar radiculopathy    PONV (postoperative nausea and vomiting)    Precancerous skin lesion (nose)    Prediabetes    Spinal stenosis      Transfusion: None   Consultants (if any):   Discharged Condition: Improved  Hospital Course: Melissa [name] is an 67 y.o. female who was admitted 12/03/2022 with a diagnosis of Total knee replacement status and went to the operating room on 12/03/2022 and underwent the above named procedures.    Surgeries: Procedure(s): COMPUTER ASSISTED TOTAL KNEE ARTHROPLASTY on 12/03/2022 Patient tolerated the surgery well. Taken to PACU where she was stabilized and then transferred to the orthopedic floor.  Started on spring, TEDs and SCDs applied bilaterally. Heels elevated on bed. No evidence of DVT. Negative Homan. Physical therapy started on day #1 for gait training and transfer. OT started day #1 for ADL and assisted  devices.  Patient's IV was d/c on day #1.  Hemovac removed on postop day 1.  Patient was able to safely and independently complete all PT goals. PT recommending discharge to home.    On post op day #1 patient was stable and ready for discharge to home with home health PT.  Implants: DePuy Attune size 7 posterior stabilized femoral component (cemented), size 7 rotating platform tibial component (cemented), 38 mm medialized dome patella (cemented), and a 5 mm stabilized rotating platform polyethylene insert.    She was given perioperative antibiotics:  Anti-infectives (From admission, onward)    Start     Dose/Rate Route Frequency Ordered Stop   12/03/22 1330  ceFAZolin (ANCEF) IVPB 2g/100 mL premix        2 g 200 mL/hr over 30 Minutes Intravenous Every 6 hours 12/03/22 1259 12/03/22 2022   12/03/22 0615  ceFAZolin (ANCEF) IVPB 2g/100 mL premix        2 g 200 mL/hr over 30 Minutes Intravenous On call to O.R. 12/03/22 0601 12/03/22 0730     .  She was given sequential compression devices, early ambulation, and aspirin, teds for DVT prophylaxis.  She benefited maximally from the hospital stay and there were no complications.    Recent vital signs:  Vitals:   12/04/22 0000 12/04/22 0338  BP: (!) 118/46 (!) 123/58  Pulse: 69 72  Resp: 20 20  Temp: 98.2 F (36.8 C) 98.3 F (36.8 C)  SpO2: 97% 96%    Recent laboratory studies:  Lab Results  Component Value Date  HGB 13.6 11/26/2022   HGB 13.8 03/15/2022   HGB 14.1 03/06/2021   Lab Results  Component Value Date   WBC 15.0 (H) 11/26/2022   PLT 195 11/26/2022   Lab Results  Component Value Date   INR 1.0 09/04/2019   Lab Results  Component Value Date   NA 140 11/26/2022   K 3.6 11/26/2022   CL 106 11/26/2022   CO2 23 11/26/2022   BUN 30 (H) 11/26/2022   CREATININE 0.81 11/26/2022   GLUCOSE 75 11/26/2022    Discharge Medications:   Allergies as of 12/04/2022   No Known Allergies      Medication List      STOP taking these medications    ibuprofen 200 MG tablet Commonly known as: ADVIL       TAKE these medications    acetaminophen 325 MG tablet Commonly known as: TYLENOL Take 1-2 tablets (325-650 mg total) by mouth every 6 (six) hours as needed for mild pain (pain score 1-3 or temp > 100.5).   aspirin 81 MG chewable tablet Chew 1 tablet (81 mg total) by mouth 2 (two) times daily.   celecoxib 200 MG capsule Commonly known as: CELEBREX Take 1 capsule (200 mg total) by mouth 2 (two) times daily for 14 days.   fluticasone 50 MCG/ACT nasal spray Commonly known as: FLONASE Place 2 sprays into both nostrils in the morning and at bedtime.   ondansetron 4 MG tablet Commonly known as: ZOFRAN Take 1 tablet (4 mg total) by mouth every 6 (six) hours as needed for nausea.   oxyCODONE 5 MG immediate release tablet Commonly known as: Oxy IR/ROXICODONE Take 1 tablet (5 mg total) by mouth every 4 (four) hours as needed for moderate pain (pain score 4-6).   pseudoephedrine 120 MG 12 hr tablet Commonly known as: SUDAFED Take 120 mg by mouth 2 (two) times daily.   senna-docusate 8.6-50 MG tablet Commonly known as: Senokot-S Take 1 tablet by mouth 2 (two) times daily.   traMADol 50 MG tablet Commonly known as: ULTRAM Take 1-2 tablets (50-100 mg total) by mouth every 4 (four) hours as needed for moderate pain.               Durable Medical Equipment  (From admission, onward)           Start     Ordered   12/03/22 1300  DME Walker rolling  Once       Question:  Patient needs a walker to treat with the following condition  Answer:  Total knee replacement status   12/03/22 1259   12/03/22 1300  DME Bedside commode  Once       Comments: Patient is not able to walk the distance required to go the bathroom, or he/she is unable to safely negotiate stairs required to access the bathroom.  A 3in1 BSC will alleviate this problem  Question:  Patient needs a bedside commode to treat  with the following condition  Answer:  Total knee replacement status   12/03/22 1259            Diagnostic Studies: DG Knee Left Port  Result Date: 12/03/2022 CLINICAL DATA:  Status post Left knee replacement. EXAM: PORTABLE LEFT KNEE - 1-2 VIEW COMPARISON:  None available FINDINGS: Left total knee prosthesis is well seated without periprosthetic fracture or lucency. Surgical drain and skin staples consistent with immediate postop status. IMPRESSION: Uncomplicated left total knee prosthesis with immediate postop changes. Electronically Signed   By:  Mauri Reading  Mir M.D.   On: 12/03/2022 12:31    Disposition:      Follow-up Information     [place], Baumann, PA Follow up on 12/20/2022.   Specialty: Physician Assistant Why: at 1:15pm Contact information: [address] [place] [address] [phone]         Donato Heinz, MD Follow up on 01/13/2023.   Specialty: Orthopedic Surgery Why: at 11:30pm Contact information: [address] [facility] [place] [address] [phone]                  Signed: Patience Musca 12/04/2022, 7:45 AM

## 2022-12-06 ENCOUNTER — Encounter: Payer: Self-pay | Admitting: Orthopedic Surgery

## 2023-02-22 ENCOUNTER — Encounter: Payer: Self-pay | Admitting: Internal Medicine

## 2023-02-23 ENCOUNTER — Other Ambulatory Visit: Payer: Self-pay | Admitting: Internal Medicine

## 2023-02-23 DIAGNOSIS — R921 Mammographic calcification found on diagnostic imaging of breast: Secondary | ICD-10-CM

## 2023-03-30 ENCOUNTER — Ambulatory Visit
Admission: RE | Admit: 2023-03-30 | Discharge: 2023-03-30 | Disposition: A | Discharge status: Home / Self Care | Payer: Medicare Other | Source: Ambulatory Visit | Attending: Internal Medicine | Admitting: Internal Medicine

## 2023-03-30 DIAGNOSIS — R921 Mammographic calcification found on diagnostic imaging of breast: Secondary | ICD-10-CM | POA: Diagnosis present

## 2023-04-04 ENCOUNTER — Other Ambulatory Visit: Payer: Self-pay | Admitting: Internal Medicine

## 2023-04-04 DIAGNOSIS — R921 Mammographic calcification found on diagnostic imaging of breast: Secondary | ICD-10-CM

## 2023-09-13 ENCOUNTER — Encounter: Payer: Self-pay | Admitting: Family Medicine

## 2023-09-13 ENCOUNTER — Ambulatory Visit (INDEPENDENT_AMBULATORY_CARE_PROVIDER_SITE_OTHER): Admitting: Family Medicine

## 2023-09-13 VITALS — BP 140/80 | HR 80 | Ht 66.0 in | Wt 278.0 lb

## 2023-09-13 DIAGNOSIS — I10 Essential (primary) hypertension: Secondary | ICD-10-CM | POA: Diagnosis not present

## 2023-09-13 DIAGNOSIS — M5416 Radiculopathy, lumbar region: Secondary | ICD-10-CM | POA: Diagnosis not present

## 2023-09-13 DIAGNOSIS — Z96653 Presence of artificial knee joint, bilateral: Secondary | ICD-10-CM | POA: Diagnosis not present

## 2023-09-13 DIAGNOSIS — E669 Obesity, unspecified: Secondary | ICD-10-CM | POA: Insufficient documentation

## 2023-09-13 DIAGNOSIS — F329 Major depressive disorder, single episode, unspecified: Secondary | ICD-10-CM

## 2023-09-13 DIAGNOSIS — Z6841 Body Mass Index (BMI) 40.0 and over, adult: Secondary | ICD-10-CM | POA: Diagnosis not present

## 2023-09-13 DIAGNOSIS — E66813 Obesity, class 3: Secondary | ICD-10-CM

## 2023-09-13 DIAGNOSIS — F411 Generalized anxiety disorder: Secondary | ICD-10-CM

## 2023-09-13 MED ORDER — BUSPIRONE HCL 10 MG PO TABS
10.0000 mg | ORAL_TABLET | Freq: Two times a day (BID) | ORAL | 3 refills | Status: DC
Start: 2023-09-13 — End: 2023-10-26

## 2023-09-13 MED ORDER — SEMAGLUTIDE-WEIGHT MANAGEMENT 0.25 MG/0.5ML ~~LOC~~ SOAJ
0.2500 mg | SUBCUTANEOUS | 0 refills | Status: AC
Start: 2023-09-13 — End: 2023-10-11

## 2023-09-13 MED ORDER — SEMAGLUTIDE-WEIGHT MANAGEMENT 0.5 MG/0.5ML ~~LOC~~ SOAJ
0.5000 mg | SUBCUTANEOUS | 3 refills | Status: DC
Start: 2023-10-12 — End: 2023-10-26

## 2023-09-13 NOTE — Progress Notes (Signed)
 New patient visit   Patient: Melissa [name]   DOB: 09-Apr-1956   68 y.o. Female  MRN: [id number] Visit Date: 09/13/2023  Today's healthcare provider: Mimi Alt, MD   Chief Complaint  Patient presents with   Establish Care    Discuss weight loss treatment    Care Management    Denied vaccines, colonoscopy, and had a dexa scan already     Subjective    Melissa [name] is a 68 y.o. female who presents today as a new patient to establish care.   HPI     Establish Care    Additional comments: Discuss weight loss treatment         Care Management    Additional comments: Denied vaccines, colonoscopy, and had a dexa scan already        Last edited by Bart Lieu, CMA on 09/13/2023  2:48 PM.       Discussed the use of AI scribe software for clinical note transcription with the patient, who gave verbal consent to proceed.  History of Present Illness Melissa [name] is a 69 year old female who presents to establish care and discuss weight management.  She has a BMI of 44.87, weighing 278 pounds, which is the highest her weight has ever been. Her weight has been steadily increasing, especially since her retirement two years ago. She is interested in medication-assisted weight loss, mentioning previous issues with obtaining Wegovy due to insurance paperwork not being submitted by her previous doctor.  She has a history of anxiety and depression, which she associates with significant life changes, including the death of her husband and her retirement. She feels like a 'recluse' and experiences frequent crying spells. She previously took Cymbalta  and trazodone  but discontinued them in 2021 as they were no longer effective. Her anxiety is described as 'terrible', with constant worry about financial stability and living alone. Her PHQ-9 score is 15.  Chronic pain due to arthritis and spinal stenosis limits her physical activity. She takes Advil, usually three  tablets twice or three times a day, to manage her pain. She has had surgeries on both knees and her back, further impacting her ability to exercise. She has a free membership to the [facility] but struggles to motivate herself to attend due to pain and self-consciousness about her weight.  She retired two years ago and reports a significant decline in physical activity and social engagement since then. She lives alone in an apartment and is on a fixed income from social security. She has a brother who lives in [place] and is her only remaining family member.      Past Medical History:  Diagnosis Date   Anterolisthesis    Anxiety    Arthritis    Depression    Difficult extubation 03/24/2022   a.) unexpected per anesthesia notes; declined neuraxial anesthetic course   DOE (dyspnea on exertion)    GERD (gastroesophageal reflux disease)    Headache    cervical   Hiatal hernia    HTN (hypertension)    Lumbar radiculopathy    PONV (postoperative nausea and vomiting)    Precancerous skin lesion (nose)    Prediabetes    Spinal stenosis     Outpatient Medications Prior to Visit  Medication Sig   ibuprofen (ADVIL) 200 MG tablet Take 600 mg by mouth in the morning and at bedtime.   [DISCONTINUED] acetaminophen  (TYLENOL ) 325 MG tablet Take 1-2 tablets (325-650 mg total) by mouth every  6 (six) hours as needed for mild pain (pain score 1-3 or temp > 100.5).   [DISCONTINUED] fluticasone (FLONASE) 50 MCG/ACT nasal spray Place 2 sprays into both nostrils in the morning and at bedtime.   [DISCONTINUED] ondansetron  (ZOFRAN ) 4 MG tablet Take 1 tablet (4 mg total) by mouth every 6 (six) hours as needed for nausea.   [DISCONTINUED] oxyCODONE  (OXY IR/ROXICODONE ) 5 MG immediate release tablet Take 1 tablet (5 mg total) by mouth every 4 (four) hours as needed for moderate pain (pain score 4-6).   [DISCONTINUED] pseudoephedrine  (SUDAFED) 120 MG 12 hr tablet Take 120 mg by mouth 2 (two) times daily.    [DISCONTINUED] senna-docusate (SENOKOT-S) 8.6-50 MG tablet Take 1 tablet by mouth 2 (two) times daily.   [DISCONTINUED] traMADol  (ULTRAM ) 50 MG tablet Take 1-2 tablets (50-100 mg total) by mouth every 4 (four) hours as needed for moderate pain.   No facility-administered medications prior to visit.    Past Surgical History:  Procedure Laterality Date   BACK SURGERY     lumbar area   KNEE ARTHROPLASTY Right 03/24/2022   Procedure: COMPUTER ASSISTED TOTAL KNEE ARTHROPLASTY;  Surgeon: Arlyne Lame, MD;  Location: [facility] ORS;  Service: Orthopedics;  Laterality: Right;   KNEE ARTHROPLASTY Left 12/03/2022   Procedure: COMPUTER ASSISTED TOTAL KNEE ARTHROPLASTY;  Surgeon: Arlyne Lame, MD;  Location: [facility] ORS;  Service: Orthopedics;  Laterality: Left;   WISDOM TOOTH EXTRACTION     Family Status  Relation Name Status   Mother  Deceased   Father  Deceased   Neg Hx  (Not Specified)  No partnership data on file   Family History  Problem Relation Age of Onset   Stroke Mother    Dementia Mother    Congestive Heart Failure Father    Breast cancer Neg Hx    Social History   Socioeconomic History   Marital status: Widowed    Spouse name: Not on file   Number of children: Not on file   Years of education: Not on file   Highest education level: 12th grade  Occupational History   Not on file  Tobacco Use   Smoking status: Former    Current packs/day: 0.00    Types: Cigarettes    Quit date: 2006    Years since quitting: 19.3   Smokeless tobacco: Never  Vaping Use   Vaping status: Never Used  Substance and Sexual Activity   Alcohol use: No   Drug use: No   Sexual activity: Not Currently  Other Topics Concern   Not on file  Social History Narrative   Lives alone   Social Drivers of Health   Financial Resource Strain: Medium Risk (09/09/2023)   Overall Financial Resource Strain (CARDIA)    Difficulty of Paying Living Expenses: Somewhat hard  Food Insecurity: Food Insecurity  Present (09/09/2023)   Hunger Vital Sign    Worried About Running Out of Food in the Last Year: Sometimes true    Ran Out of Food in the Last Year: Sometimes true  Transportation Needs: No Transportation Needs (09/09/2023)   PRAPARE - Administrator, Civil Service (Medical): No    Lack of Transportation (Non-Medical): No  Physical Activity: Unknown (09/09/2023)   Exercise Vital Sign    Days of Exercise per Week: 0 days    Minutes of Exercise per Session: Not on file  Stress: Stress Concern Present (09/09/2023)   [employer] of Occupational Health - Occupational Stress Questionnaire  Feeling of Stress : Very much  Social Connections: Socially Isolated (09/09/2023)   Social Connection and Isolation Panel [NHANES]    Frequency of Communication with Friends and Family: Twice a week    Frequency of Social Gatherings with Friends and Family: Never    Attends Religious Services: Never    Database administrator or Organizations: No    Attends Engineer, structural: Not on file    Marital Status: Widowed     Allergies  Allergen Reactions   Celecoxib  Other (See Comments)    Hypertension     There is no immunization history on file for this patient.  Health Maintenance  Topic Date Due   Medicare Annual Wellness (AWV)  Never done   Hepatitis C Screening  Never done   DTaP/Tdap/Td (1 - Tdap) Never done   Colonoscopy  Never done   Pneumonia Vaccine 66+ Years old (1 of 1 - PCV) Never done   Zoster Vaccines- Shingrix (1 of 2) Never done   DEXA SCAN  Never done   COVID-19 Vaccine (3 - 2024-25 season) 12/26/2022   INFLUENZA VACCINE  11/25/2023   MAMMOGRAM  09/12/2024   HPV VACCINES  Aged Out   Meningococcal B Vaccine  Aged Out    Patient Care Team: Mimi Alt, MD as PCP - General (Family Medicine)  Review of Systems   Physical Exam    Last CBC Lab Results  Component Value Date   WBC 15.0 (H) 11/26/2022   HGB 13.6 11/26/2022   HCT  45.1 11/26/2022   MCV 86.1 11/26/2022   MCH 26.0 11/26/2022   RDW 15.1 11/26/2022   PLT 195 11/26/2022   Last metabolic panel Lab Results  Component Value Date   GLUCOSE 90 09/13/2023   NA 142 09/13/2023   K 4.8 09/13/2023   CL 104 09/13/2023   CO2 22 09/13/2023   BUN 25 09/13/2023   CREATININE 0.97 09/13/2023   GFRNONAA >60 11/26/2022   CALCIUM 9.6 09/13/2023   PROT 6.7 09/13/2023   ALBUMIN 4.2 09/13/2023   LABGLOB 2.5 09/13/2023   BILITOT 0.3 09/13/2023   ALKPHOS 117 09/13/2023   AST 18 09/13/2023   ALT 13 09/13/2023   ANIONGAP 11 11/26/2022   Last lipids Lab Results  Component Value Date   CHOL 162 09/13/2023   HDL 32 (L) 09/13/2023   LDLCALC 111 (H) 09/13/2023   TRIG 100 09/13/2023   CHOLHDL 5.1 (H) 09/13/2023   Last hemoglobin A1c Lab Results  Component Value Date   HGBA1C 5.5 09/13/2023   Last thyroid functions Lab Results  Component Value Date   TSH 0.564 09/13/2023   Last vitamin D No results found for: "25OHVITD2", "25OHVITD3", "VD25OH" Last vitamin B12 and Folate No results found for: "VITAMINB12", "FOLATE"   Flowsheet Row Office Visit from 09/13/2023 in [facility]  PHQ-9 Total Score 15         09/13/2023    2:50 PM  GAD 7 : Generalized Anxiety Score  Nervous, Anxious, on Edge 3  Control/stop worrying 3  Worry too much - different things 3  Trouble relaxing 2  Restless 0  Easily annoyed or irritable 3  Afraid - awful might happen 1  Total GAD 7 Score 15  Anxiety Difficulty Somewhat difficult        Objective    BP (!) 140/80   Pulse 80   Ht 5\' 6"  (1.676 m)   Wt 278 lb (126.1 kg)   SpO2 96%  BMI 44.87 kg/m  BP Readings from Last 3 Encounters:  09/13/23 (!) 140/80  12/04/22 116/73  11/26/22 (!) 149/83   Wt Readings from Last 3 Encounters:  09/13/23 278 lb (126.1 kg)  12/03/22 253 lb 1.6 oz (114.8 kg)  11/26/22 253 lb 1.6 oz (114.8 kg)        Depression Screen    09/13/2023    2:50 PM   PHQ 2/9 Scores  PHQ - 2 Score 4  PHQ- 9 Score 15   Results for orders placed or performed in visit on 09/13/23  CMP14+EGFR  Result Value Ref Range   Glucose 90 70 - 99 mg/dL   BUN 25 8 - 27 mg/dL   Creatinine, Ser 1.61 0.57 - 1.00 mg/dL   eGFR 64 >09 UE/AVW/0.98   BUN/Creatinine Ratio 26 12 - 28   Sodium 142 134 - 144 mmol/L   Potassium 4.8 3.5 - 5.2 mmol/L   Chloride 104 96 - 106 mmol/L   CO2 22 20 - 29 mmol/L   Calcium 9.6 8.7 - 10.3 mg/dL   Total Protein 6.7 6.0 - 8.5 g/dL   Albumin 4.2 3.9 - 4.9 g/dL   Globulin, Total 2.5 1.5 - 4.5 g/dL   Bilirubin Total 0.3 0.0 - 1.2 mg/dL   Alkaline Phosphatase 117 44 - 121 IU/L   AST 18 0 - 40 IU/L   ALT 13 0 - 32 IU/L  Hemoglobin A1c  Result Value Ref Range   Hgb A1c MFr Bld 5.5 4.8 - 5.6 %   Est. average glucose Bld gHb Est-mCnc 111 mg/dL  Lipid panel  Result Value Ref Range   Cholesterol, Total 162 100 - 199 mg/dL   Triglycerides 119 0 - 149 mg/dL   HDL 32 (L) >14 mg/dL   VLDL Cholesterol Cal 19 5 - 40 mg/dL   LDL Chol Calc (NIH) 782 (H) 0 - 99 mg/dL   Chol/HDL Ratio 5.1 (H) 0.0 - 4.4 ratio  TSH+T4F+T3Free  Result Value Ref Range   TSH 0.564 0.450 - 4.500 uIU/mL   T3, Free 3.2 2.0 - 4.4 pg/mL   Free T4 1.43 0.82 - 1.77 ng/dL     Physical Exam General: Alert, no acute distress Cardio: Normal S1 and S2, RRR, no r/m/g Pulm: CTAB, normal work of breathing ABD: soft, abdomen is not distended, there is no tenderness to palpation, normal BS  Extremities: no LE edema      Assessment & Plan      Problem List Items Addressed This Visit       Cardiovascular and Mediastinum   Essential hypertension   Relevant Orders   CMP14+EGFR (Completed)     Nervous and Auditory   Lumbar radiculopathy   Relevant Medications   busPIRone (BUSPAR) 10 MG tablet   Semaglutide-Weight Management 0.25 MG/0.5ML SOAJ   Semaglutide-Weight Management 0.5 MG/0.5ML SOAJ (Start on 10/12/2023)     Other   Total knee replacement status    Reactive depression   Depression Depression with a PHQ-9 score of 15, indicating moderate to severe depression. She reports feelings of worthlessness and lack of purpose following retirement and the death of her husband. Previous medications, including Cymbalta  and trazodone , were discontinued due to lack of efficacy. Discussed the importance of addressing both medication and therapy for optimal management. - Refer to psychiatry for evaluation and management of depression - Encourage engagement with a therapist for talk therapy      Relevant Medications   busPIRone (BUSPAR) 10 MG tablet  Other Relevant Orders   TSH+T4F+T3Free (Completed)   Ambulatory referral to Psychiatry   RESOLVED: Obesity, unspecified - Primary   Relevant Medications   Semaglutide-Weight Management 0.25 MG/0.5ML SOAJ   Semaglutide-Weight Management 0.5 MG/0.5ML SOAJ (Start on 10/12/2023)   Other Relevant Orders   CMP14+EGFR (Completed)   Hemoglobin A1c (Completed)   Lipid panel (Completed)   TSH+T4F+T3Free (Completed)   GAD (generalized anxiety disorder)   Anxiety disorder Generalized anxiety disorder with significant worry about financial stability and life changes following retirement and loss of family members. Previous medications, including duloxetine  and trazodone , were discontinued due to lack of efficacy. She reports high anxiety levels, impacting quality of life. Discussed the potential benefits of Buspar for anxiety management. - Prescribe Buspar 10 mg twice a day for anxiety - Refer to psychiatry for further evaluation and management - Encourage engagement with a therapist for talk therapy      Relevant Medications   busPIRone (BUSPAR) 10 MG tablet   Other Relevant Orders   TSH+T4F+T3Free (Completed)   Ambulatory referral to Psychiatry   BMI 40.0-44.9, adult (HCC) (Chronic)   Obesity, Chronic Obesity with a BMI of 44.87, indicating severe obesity. She is interested in medication-assisted weight  loss. Previous attempts to obtain [facility] were unsuccessful due to insurance issues. She is a candidate for Wegovy, and the plan is to assist with prior authorization to facilitate coverage. Emphasized the importance of combining medication with lifestyle changes, including physical activity and dietary modifications, to achieve and maintain weight loss. - Prescribe Wegovy 0.25 mg once a week for 4 weeks, then increase to 0.5 mg once a week for 4 weeks - Submit prior authorization for [employer] - Encourage 30-45 minutes of physical activity most days of the week, focusing on water  aerobics at the [facility] - Advise dietary modifications with an emphasis on increasing vegetable intake      Relevant Medications   Semaglutide-Weight Management 0.25 MG/0.5ML SOAJ   Semaglutide-Weight Management 0.5 MG/0.5ML SOAJ (Start on 10/12/2023)     Assessment & Plan     Elevated BP  Hypertension with a recent reading of 140/80 mmHg. She reports that her blood pressure is usually around 120/80 mmHg. The current reading may be influenced by anxiety and the stress of the visit. Plan to monitor and reassess before considering medication. - Instruct to monitor blood pressure daily and keep a record - Re-evaluate blood pressure in 6 weeks - Consider antihypertensive medication if blood pressure remains elevated  Arthritis Chronic arthritis with significant pain managed with over-the-counter ibuprofen. She reports taking three Advil tablets two to three times a day, depending on pain severity. Encouraged non-pharmacological interventions to reduce joint stress. - Encourage participation in water  aerobics to reduce joint stress and improve mobility  Spinal stenosis Chronic spinal stenosis contributing to back pain and limited mobility. She reports significant pain and has undergone previous surgeries. Encouraged non-pharmacological interventions to improve mobility. - Encourage participation in water  aerobics to reduce  joint stress and improve mobility      Return in about 6 weeks (around 10/25/2023) for Weight MGMT.      Mimi Alt, MD  [facility] [phone] (phone) [phone] (fax)  [facility]

## 2023-09-14 ENCOUNTER — Ambulatory Visit: Payer: Self-pay | Admitting: Family Medicine

## 2023-09-14 ENCOUNTER — Telehealth: Payer: Self-pay

## 2023-09-14 ENCOUNTER — Other Ambulatory Visit (HOSPITAL_COMMUNITY): Payer: Self-pay

## 2023-09-14 DIAGNOSIS — F329 Major depressive disorder, single episode, unspecified: Secondary | ICD-10-CM | POA: Insufficient documentation

## 2023-09-14 LAB — CMP14+EGFR
ALT: 13 IU/L (ref 0–32)
AST: 18 IU/L (ref 0–40)
Albumin: 4.2 g/dL (ref 3.9–4.9)
Alkaline Phosphatase: 117 IU/L (ref 44–121)
BUN/Creatinine Ratio: 26 (ref 12–28)
BUN: 25 mg/dL (ref 8–27)
Bilirubin Total: 0.3 mg/dL (ref 0.0–1.2)
CO2: 22 mmol/L (ref 20–29)
Calcium: 9.6 mg/dL (ref 8.7–10.3)
Chloride: 104 mmol/L (ref 96–106)
Creatinine, Ser: 0.97 mg/dL (ref 0.57–1.00)
Globulin, Total: 2.5 g/dL (ref 1.5–4.5)
Glucose: 90 mg/dL (ref 70–99)
Potassium: 4.8 mmol/L (ref 3.5–5.2)
Sodium: 142 mmol/L (ref 134–144)
Total Protein: 6.7 g/dL (ref 6.0–8.5)
eGFR: 64 mL/min/{1.73_m2} (ref >59)

## 2023-09-14 LAB — LIPID PANEL
Chol/HDL Ratio: 5.1 ratio — ABNORMAL HIGH (ref 0.0–4.4)
Cholesterol, Total: 162 mg/dL (ref 100–199)
HDL: 32 mg/dL — ABNORMAL LOW (ref >39)
LDL Chol Calc (NIH): 111 mg/dL — ABNORMAL HIGH (ref 0–99)
Triglycerides: 100 mg/dL (ref 0–149)
VLDL Cholesterol Cal: 19 mg/dL (ref 5–40)

## 2023-09-14 LAB — TSH+T4F+T3FREE
Free T4: 1.43 ng/dL (ref 0.82–1.77)
T3, Free: 3.2 pg/mL (ref 2.0–4.4)
TSH: 0.564 u[IU]/mL (ref 0.450–4.500)

## 2023-09-14 LAB — HEMOGLOBIN A1C
Est. average glucose Bld gHb Est-mCnc: 111 mg/dL
Hgb A1c MFr Bld: 5.5 % (ref 4.8–5.6)

## 2023-09-14 NOTE — Assessment & Plan Note (Signed)
 Depression Depression with a PHQ-9 score of 15, indicating moderate to severe depression. She reports feelings of worthlessness and lack of purpose following retirement and the death of her husband. Previous medications, including Cymbalta  and trazodone , were discontinued due to lack of efficacy. Discussed the importance of addressing both medication and therapy for optimal management. - Refer to psychiatry for evaluation and management of depression - Encourage engagement with a therapist for talk therapy

## 2023-09-14 NOTE — Assessment & Plan Note (Signed)
 Anxiety disorder Generalized anxiety disorder with significant worry about financial stability and life changes following retirement and loss of family members. Previous medications, including duloxetine  and trazodone , were discontinued due to lack of efficacy. She reports high anxiety levels, impacting quality of life. Discussed the potential benefits of Buspar for anxiety management. - Prescribe Buspar 10 mg twice a day for anxiety - Refer to psychiatry for further evaluation and management - Encourage engagement with a therapist for talk therapy

## 2023-09-14 NOTE — Telephone Encounter (Signed)
 Pharmacy Patient Advocate Encounter   Received notification from Onbase that prior authorization for Wegovy 0.25MG /0.5ML auto-injectors is required/requested.   Insurance verification completed.   The patient is insured through [employer] .   Per test claim: Product not on formulary, please consider changing therapies.

## 2023-09-14 NOTE — Telephone Encounter (Signed)
 Please see the message below.

## 2023-09-14 NOTE — Assessment & Plan Note (Signed)
 Obesity, Chronic Obesity with a BMI of 44.87, indicating severe obesity. She is interested in medication-assisted weight loss. Previous attempts to obtain [facility] were unsuccessful due to insurance issues. She is a candidate for Wegovy, and the plan is to assist with prior authorization to facilitate coverage. Emphasized the importance of combining medication with lifestyle changes, including physical activity and dietary modifications, to achieve and maintain weight loss. - Prescribe Wegovy 0.25 mg once a week for 4 weeks, then increase to 0.5 mg once a week for 4 weeks - Submit prior authorization for [employer] - Encourage 30-45 minutes of physical activity most days of the week, focusing on water  aerobics at the [facility] - Advise dietary modifications with an emphasis on increasing vegetable intake

## 2023-09-15 ENCOUNTER — Other Ambulatory Visit (HOSPITAL_COMMUNITY): Payer: Self-pay

## 2023-09-16 NOTE — Telephone Encounter (Signed)
 Please let patient know that Melissa [name] is not on her insurance formulary.   Please advise patient to discuss options for weight loss with her insurance company including  -Zepbound  -Contrave  -Phentermine -Metformin  -Qsymia

## 2023-09-16 NOTE — Telephone Encounter (Signed)
 Called and was able to relay the message to the pt per Dr R regarding the weight loss medication. She verbally stated she understood and would reach out to them

## 2023-10-04 ENCOUNTER — Ambulatory Visit
Admission: RE | Admit: 2023-10-04 | Discharge: 2023-10-04 | Disposition: A | Discharge status: Home / Self Care | Payer: Medicare Other | Source: Ambulatory Visit | Attending: Internal Medicine | Admitting: Internal Medicine

## 2023-10-04 DIAGNOSIS — R921 Mammographic calcification found on diagnostic imaging of breast: Secondary | ICD-10-CM | POA: Diagnosis present

## 2023-10-26 ENCOUNTER — Encounter: Payer: Self-pay | Admitting: Family Medicine

## 2023-10-26 ENCOUNTER — Ambulatory Visit: Admitting: Family Medicine

## 2023-10-26 VITALS — BP 132/83 | HR 86 | Ht 66.0 in | Wt 281.0 lb

## 2023-10-26 DIAGNOSIS — M19012 Primary osteoarthritis, left shoulder: Secondary | ICD-10-CM

## 2023-10-26 DIAGNOSIS — F411 Generalized anxiety disorder: Secondary | ICD-10-CM

## 2023-10-26 DIAGNOSIS — Z96651 Presence of right artificial knee joint: Secondary | ICD-10-CM | POA: Diagnosis not present

## 2023-10-26 DIAGNOSIS — M19011 Primary osteoarthritis, right shoulder: Secondary | ICD-10-CM | POA: Diagnosis not present

## 2023-10-26 DIAGNOSIS — M4310 Spondylolisthesis, site unspecified: Secondary | ICD-10-CM

## 2023-10-26 DIAGNOSIS — I10 Essential (primary) hypertension: Secondary | ICD-10-CM

## 2023-10-26 DIAGNOSIS — J309 Allergic rhinitis, unspecified: Secondary | ICD-10-CM | POA: Insufficient documentation

## 2023-10-26 DIAGNOSIS — F3340 Major depressive disorder, recurrent, in remission, unspecified: Secondary | ICD-10-CM | POA: Diagnosis not present

## 2023-10-26 DIAGNOSIS — Z96653 Presence of artificial knee joint, bilateral: Secondary | ICD-10-CM | POA: Diagnosis not present

## 2023-10-26 DIAGNOSIS — M1712 Unilateral primary osteoarthritis, left knee: Secondary | ICD-10-CM

## 2023-10-26 DIAGNOSIS — J3089 Other allergic rhinitis: Secondary | ICD-10-CM | POA: Diagnosis not present

## 2023-10-26 DIAGNOSIS — Z6841 Body Mass Index (BMI) 40.0 and over, adult: Secondary | ICD-10-CM | POA: Diagnosis not present

## 2023-10-26 MED ORDER — TRAMADOL HCL 50 MG PO TABS
50.0000 mg | ORAL_TABLET | Freq: Two times a day (BID) | ORAL | 0 refills | Status: DC | PRN
Start: 2023-10-26 — End: 2024-02-07

## 2023-10-26 MED ORDER — FLUTICASONE PROPIONATE 50 MCG/ACT NA SUSP
2.0000 | Freq: Every day | NASAL | 6 refills | Status: AC
Start: 2023-10-26 — End: ?

## 2023-10-26 MED ORDER — METFORMIN HCL 500 MG PO TABS
ORAL_TABLET | ORAL | 0 refills | Status: DC
Start: 2023-10-26 — End: 2023-12-14

## 2023-10-26 MED ORDER — PHENTERMINE HCL 15 MG PO CAPS: ORAL_CAPSULE | ORAL | 0 refills | Status: DC

## 2023-10-26 NOTE — Progress Notes (Signed)
 Established patient visit   Patient: Melissa [name]   DOB: 1956-03-30   67 y.o. Female  MRN: [id number] Visit Date: 10/26/2023  Today's healthcare provider: Rockie Agent, MD   Chief Complaint  Patient presents with   Weight Loss    Her insurance didn't cover the GLP1 medication    Subjective     HPI     Weight Loss    Additional comments: Her insurance didn't cover the GLP1 medication       Last edited by Thelbert Eulalio [name], CMA on 10/26/2023  3:18 PM.       Discussed the use of AI scribe software for clinical note transcription with the patient, who gave verbal consent to proceed.  History of Present Illness Melissa [name] is a 68 year old female who presents for a follow-up visit regarding weight management.  She is exploring alternative medications for weight management due to lack of insurance coverage for Wegovy , considering options such as Zepbound, Contrave, phentermine, metformin, and Qsymia.  She has a history of generalized anxiety, essential hypertension, osteoarthritis, radiculopathy, and recurrent depression. She previously tried Buspar  for anxiety but discontinued it after two days due to side effects including an upset stomach and headache. She felt good before taking Buspar  but decided to stop after experiencing these side effects.  She experiences significant joint pain, particularly in her left hand, which is a new symptom. She has a history of knee replacements and back surgery, and she receives injections for shoulder pain. She currently manages pain with Advil but finds it insufficient, especially at night. She is not currently seeing a pain management specialist. She recalls that tramadol  was effective for her pain after previous surgeries. Her joint pain is widespread, affecting all her joints, and notes that her shoulders are the only joints that have not been replaced.  She is working on improving her lifestyle, including increasing  physical activity and addressing agoraphobia. Taking showers daily helps her feel invigorated and more willing to go out. She acknowledges that she has let herself go since retiring and undergoing knee surgeries, but she is making efforts to change this. She inquired about arthritis water  classes at the [facility] and is in the process of finding a suitable bathing suit to participate.  She does not drink water  regularly and acknowledges the need to improve her hydration. She also requests a prescription for Flonase to manage her seasonal allergic rhinitis, as it would be more cost-effective than purchasing it over the counter.     Past Medical History:  Diagnosis Date   Anterolisthesis    Anxiety    Arthritis    Depression    Difficult extubation 03/24/2022   a.) unexpected per anesthesia notes; declined neuraxial anesthetic course   DOE (dyspnea on exertion)    GERD (gastroesophageal reflux disease)    Headache    cervical   Hiatal hernia    HTN (hypertension)    Lumbar radiculopathy    PONV (postoperative nausea and vomiting)    Precancerous skin lesion (nose)    Prediabetes    Spinal stenosis     Medications: Outpatient Medications Prior to Visit  Medication Sig Note   ibuprofen (ADVIL) 200 MG tablet Take 600 mg by mouth in the morning and at bedtime.    [DISCONTINUED] busPIRone  (BUSPAR ) 10 MG tablet Take 1 tablet (10 mg total) by mouth 2 (two) times daily. 10/26/2023: pt stopped after 2 days   [DISCONTINUED] Semaglutide -Weight Management 0.5 MG/0.5ML  SOAJ Inject 0.5 mg into the skin once a week for 28 days.    No facility-administered medications prior to visit.    Review of Systems  Last CBC Lab Results  Component Value Date   WBC 15.0 (H) 11/26/2022   HGB 13.6 11/26/2022   HCT 45.1 11/26/2022   MCV 86.1 11/26/2022   MCH 26.0 11/26/2022   RDW 15.1 11/26/2022   PLT 195 11/26/2022   Last metabolic panel Lab Results  Component Value Date   GLUCOSE 90 09/13/2023   NA  142 09/13/2023   K 4.8 09/13/2023   CL 104 09/13/2023   CO2 22 09/13/2023   BUN 25 09/13/2023   CREATININE 0.97 09/13/2023   EGFR 64 09/13/2023   CALCIUM 9.6 09/13/2023   PROT 6.7 09/13/2023   ALBUMIN 4.2 09/13/2023   LABGLOB 2.5 09/13/2023   BILITOT 0.3 09/13/2023   ALKPHOS 117 09/13/2023   AST 18 09/13/2023   ALT 13 09/13/2023   ANIONGAP 11 11/26/2022   Last lipids Lab Results  Component Value Date   CHOL 162 09/13/2023   HDL 32 (L) 09/13/2023   LDLCALC 111 (H) 09/13/2023   TRIG 100 09/13/2023   CHOLHDL 5.1 (H) 09/13/2023   Last hemoglobin A1c Lab Results  Component Value Date   HGBA1C 5.5 09/13/2023   Last thyroid functions Lab Results  Component Value Date   TSH 0.564 09/13/2023        Objective    BP 132/83   Pulse 86   Ht 5' 6 (1.676 m)   Wt 281 lb (127.5 kg)   SpO2 97%   BMI 45.35 kg/m  BP Readings from Last 3 Encounters:  10/26/23 132/83  09/13/23 (!) 140/80  12/04/22 116/73   Wt Readings from Last 3 Encounters:  10/26/23 281 lb (127.5 kg)  09/13/23 278 lb (126.1 kg)  12/03/22 253 lb 1.6 oz (114.8 kg)        Physical Exam  General: Alert, no acute distress Cardio: Normal S1 and S2, RRR, no r/m/g Pulm: CTAB, normal work of breathing JAI:wnmfjo BS   No results found for any visits on 10/26/23.   Assessment & Plan     Problem List Items Addressed This Visit       Cardiovascular and Mediastinum   Essential hypertension     Respiratory   Allergic rhinitis due to allergen   Relevant Medications   fluticasone (FLONASE) 50 MCG/ACT nasal spray     Musculoskeletal and Integument   Primary osteoarthritis of left knee   Relevant Medications   traMADol  (ULTRAM ) 50 MG tablet   Primary osteoarthritis of both shoulders   Relevant Medications   traMADol  (ULTRAM ) 50 MG tablet   Anterolisthesis   Relevant Medications   traMADol  (ULTRAM ) 50 MG tablet     Other   Total knee replacement status   Relevant Medications   traMADol   (ULTRAM ) 50 MG tablet   Status post total right knee replacement   Relevant Medications   traMADol  (ULTRAM ) 50 MG tablet   Recurrent major depressive disorder, in remission (HCC)   GAD (generalized anxiety disorder)   BMI 40.0-44.9, adult (HCC) - Primary (Chronic)   Relevant Medications   phentermine 15 MG capsule   metFORMIN (GLUCOPHAGE) 500 MG tablet     Assessment & Plan Obesity Current weight is 281 pounds with a BMI of 45. Insurance does not cover Wegovy . Alternative medications discussed include Zepbound, Contrave, phentermine, and metformin. Phentermine is considered with caution due to its potential to  increase blood pressure and heart rate, typically used for 3 to 6 months, starting at a low dose to ensure tolerance. Metformin is considered for its modest weight loss benefits, potentially resulting in a 5% body weight loss, and its role in improving insulin resistance. - Prescribe phentermine starting at 15 mg once daily for 15 days, then increase to 30 mg daily if tolerated. Follow up in one month to assess tolerance and effectiveness. - Prescribe metformin starting at 500 mg once daily for 4-5 days, then increase to 500 mg twice daily, and further increase to 1000 mg once daily, and finally to 1000 mg twice daily if tolerated.  Essential Hypertension Blood pressure is currently 132/83 mmHg. Phentermine's potential to increase blood pressure is a concern. Heart rate is 86 bpm, reassuring for starting phentermine.  Generalized Anxiety Disorder Previously prescribed buspirone  was discontinued due to side effects including stomach upset and headache. Wellbutrin and naltrexone were considered as part of the weight loss strategy, which may also aid in managing anxiety and depression. - Consider Wellbutrin 150 mg twice daily and naltrexone 50 mg daily as part of weight loss and anxiety management.  Osteoarthritis Experiences joint pain, particularly in the left hand and shoulders, with  a history of knee replacements and back surgery. Reports breakthrough pain, especially at night, and is currently taking ibuprofen. Tramadol  was previously effective for pain management. - Prescribe tramadol  50 mg every 12 hours as needed for severe pain. - Encourage participation in arthritis water  classes for physical activity.  Seasonal Allergic Rhinitis Experiences morning symptoms of allergic rhinitis and currently uses over-the-counter sinus medication. - Prescribe Flonase to manage allergic rhinitis symptoms.  General Health Maintenance Working on lifestyle modifications, including increasing physical activity and improving personal hygiene. Considering joining arthritis water  classes and is in the process of finding a suitable bathing suit. - Encourage regular physical activity and participation in arthritis water  classes. - Advise on the importance of hydration and drinking water  regularly.     Return in about 1 month (around 11/26/2023) for Weight MGMT,OA.         Rockie Agent, MD  [facility] [phone] (phone) [phone] (fax)  [facility]

## 2023-10-26 NOTE — Patient Instructions (Addendum)
     VISIT SUMMARY: Today, we discussed your weight management options, including alternative medications due to lack of insurance coverage for Wegovy . We also addressed your joint pain, anxiety, and seasonal allergies. You are making positive lifestyle changes, including increasing physical activity and improving personal hygiene.  YOUR PLAN: -OBESITY: Obesity means having an excessive amount of body fat. We discussed alternative medications for weight management. You will start with phentermine at 15 mg once daily for 15 days, then increase to 30 mg daily if tolerated.   Additionally, you will start metformin at 500 mg once daily for 4-5 days, then increase to 500 mg twice daily, and further increase to 1000 mg once daily, and finally to 1000 mg twice daily if tolerated.  -ESSENTIAL HYPERTENSION: Essential hypertension is high blood pressure with no identifiable cause. Your blood pressure is currently 132/83 mmHg. We will monitor your blood pressure closely while you are on phentermine, as it can increase blood pressure.  -GENERALIZED ANXIETY DISORDER: Generalized anxiety disorder involves excessive worry about various aspects of life. You previously discontinued buspirone  due to side effects. We are considering Wellbutrin 150 mg twice daily and naltrexone 50 mg daily to help with both weight loss and anxiety management.  -OSTEOARTHRITIS: Osteoarthritis is a condition that causes joint pain and stiffness. You experience significant joint pain, especially in your left hand and shoulders. We will prescribe tramadol  50 mg every 12 hours as needed for severe pain and encourage you to participate in arthritis water  classes for physical activity.  -SEASONAL ALLERGIC RHINITIS: Seasonal allergic rhinitis is an allergic reaction to pollen and other allergens that occur seasonally. We will prescribe Flonase to manage your symptoms.  -GENERAL HEALTH MAINTENANCE: You are working on lifestyle modifications,  including increasing physical activity and improving personal hygiene. We encourage you to continue these efforts and to drink water  regularly.  INSTRUCTIONS: Please follow up in one month to assess the tolerance and effectiveness of phentermine and metformin. Continue to monitor your blood pressure regularly. If you experience any severe side effects or have concerns, contact our office immediately.                      Contains text generated by Abridge.                                 Contains text generated by Abridge.

## 2023-10-31 ENCOUNTER — Telehealth: Payer: Self-pay | Admitting: Family Medicine

## 2023-10-31 NOTE — Telephone Encounter (Signed)
 Yes she can take her steroid injection for the shoulder

## 2023-10-31 NOTE — Telephone Encounter (Signed)
 Copied from CRM [id number]. Topic: Clinical - Medical Advice >> Oct 31, 2023  3:13 PM Donee H wrote: Reason for CRM: Patient states just began to take weight loss medication on October 28, 2023. Patient is wanting to know if it is okay for her to still take her cortisone shot in shoulder. Please follow up with patient on further instructions. Callback number [phone]

## 2023-11-01 NOTE — Telephone Encounter (Signed)
 Called and was able to inform the pt, the message per Dr R. She verbally stated she understood and had no other concerns

## 2023-11-03 DIAGNOSIS — M7581 Other shoulder lesions, right shoulder: Secondary | ICD-10-CM | POA: Diagnosis not present

## 2023-11-03 DIAGNOSIS — M7582 Other shoulder lesions, left shoulder: Secondary | ICD-10-CM | POA: Diagnosis not present

## 2023-11-24 ENCOUNTER — Telehealth: Payer: Self-pay | Admitting: Family Medicine

## 2023-11-24 DIAGNOSIS — Z6841 Body Mass Index (BMI) 40.0 and over, adult: Secondary | ICD-10-CM

## 2023-11-24 NOTE — Telephone Encounter (Signed)
 Requested medications are due for refill today.  yes  Requested medications are on the active medications list.  yes  Last refill. 10/26/2023 #45 0 rf  Future visit scheduled.   yes  Notes to clinic.  Refill not delegated. Rx will expire 11/25/2023.    Requested Prescriptions  Pending Prescriptions Disp Refills   phentermine  15 MG capsule 45 capsule 0    Sig: Take 1 capsule (15 mg total) by mouth every morning for 15 days, THEN 2 capsules (30 mg total) every morning for 15 days.     Not Delegated - Neurology: Anticonvulsants - Controlled - phentermine  hydrochloride Failed - 11/24/2023  4:36 PM      Failed - This refill cannot be delegated      Passed - eGFR in normal range and within 360 days    GFR calc Af Amer  Date Value Ref Range Status  09/04/2019 >60 >60 mL/min Final   GFR, Estimated  Date Value Ref Range Status  11/26/2022 >60 >60 mL/min Final    Comment:    (NOTE) Calculated using the CKD-EPI Creatinine Equation (2021)    eGFR  Date Value Ref Range Status  09/13/2023 64 >59 mL/min/1.73 Final         Passed - Cr in normal range and within 360 days    Creatinine, Ser  Date Value Ref Range Status  09/13/2023 0.97 0.57 - 1.00 mg/dL Final         Passed - Last BP in normal range    BP Readings from Last 1 Encounters:  10/26/23 132/83         Passed - Valid encounter within last 6 months    Recent Outpatient Visits           4 weeks ago BMI 40.0-44.9, adult (HCC)   [facility] Simmons-Robinson, [place], MD   2 months ago Class 3 severe obesity with serious comorbidity and body mass index (BMI) of 40.0 to 44.9 in adult, unspecified obesity type   [facility] Simmons-Robinson, [place], MD              Passed - Weight completed in the last 3 months    Wt Readings from Last 1 Encounters:  10/26/23 281 lb (127.5 kg)

## 2023-11-24 NOTE — Telephone Encounter (Unsigned)
 Copied from CRM [id number]. Topic: Clinical - Medication Refill >> Nov 24, 2023  7:49 AM Cynthia K wrote: Medication: phentermine  15 MG capsule  Has the patient contacted their pharmacy? Yes (Agent: If no, request that the patient contact the pharmacy for the refill. If patient does not wish to contact the pharmacy document the reason why and proceed with request.) (Agent: If yes, when and what did the pharmacy advise?) Pharmacy needs order to refill  This is the patient's preferred pharmacy:  [facility] - [place], [place] - [address] [place] [address] Phone: [phone] Fax: [phone]  Is this the correct pharmacy for this prescription? Yes If no, delete pharmacy and type the correct one.   Has the prescription been filled recently? No  Is the patient out of the medication? No  Has the patient been seen for an appointment in the last year OR does the patient have an upcoming appointment? Yes  Can we respond through MyChart? Yes  Agent: Please be advised that Rx refills may take up to 3 business days. We ask that you follow-up with your pharmacy.

## 2023-11-25 MED ORDER — PHENTERMINE HCL 30 MG PO CAPS: 30.0000 mg | ORAL_CAPSULE | ORAL | 0 refills | Status: DC

## 2023-12-14 ENCOUNTER — Ambulatory Visit: Admitting: Family Medicine

## 2023-12-14 ENCOUNTER — Encounter: Payer: Self-pay | Admitting: Family Medicine

## 2023-12-14 VITALS — BP 124/87 | HR 80 | Resp 20 | Ht 66.0 in | Wt 265.0 lb

## 2023-12-14 DIAGNOSIS — I10 Essential (primary) hypertension: Secondary | ICD-10-CM

## 2023-12-14 DIAGNOSIS — Z5941 Food insecurity: Secondary | ICD-10-CM

## 2023-12-14 DIAGNOSIS — Z6841 Body Mass Index (BMI) 40.0 and over, adult: Secondary | ICD-10-CM

## 2023-12-14 DIAGNOSIS — G44209 Tension-type headache, unspecified, not intractable: Secondary | ICD-10-CM

## 2023-12-14 MED ORDER — PHENTERMINE HCL 37.5 MG PO CAPS
37.5000 mg | ORAL_CAPSULE | ORAL | 2 refills | Status: DC
Start: 2023-12-14 — End: 2024-02-07

## 2023-12-14 NOTE — Assessment & Plan Note (Signed)
 Chronic, BMI 42.77 Obesity with recent weight loss from 281 to 265 pounds. Currently on phentermine  30 mg daily, which is well-tolerated and effective for weight loss. Metformin  500 mg was discontinued due to adverse effects including extreme weakness and violent diarrhea. A1c is normal at 5.5%. - Increase phentermine  to 37.5 mg once daily. - Encourage 30-minute walks four days a week. - Recommend resistance training or weightlifting two days a week to prevent muscle loss. - Advise on maintaining adequate protein intake, considering financial constraints. - Encourage continued water  intake.

## 2023-12-14 NOTE — Assessment & Plan Note (Signed)
 Chronic  No current antihypertensives

## 2023-12-14 NOTE — Progress Notes (Signed)
 Established patient visit   Patient: Melissa [name]   DOB: 1956/03/05   68 y.o. Female  MRN: [id number] Visit Date: 12/14/2023  Today's healthcare provider: Rockie Agent, MD   Chief Complaint  Patient presents with   Follow-up    1 month weight management   Subjective     HPI     Follow-up    Additional comments: 1 month weight management      Last edited by Wilfred Hargis RAMAN, CMA on 12/14/2023  9:17 AM.       Discussed the use of AI scribe software for clinical note transcription with the patient, who gave verbal consent to proceed.  History of Present Illness Melissa [name] is a 68 year old female who presents for follow-up on weight management.  She has successfully reduced her weight from 281 pounds to 265 pounds. She is currently taking phentermine  30 mg daily for weight loss and has discontinued metformin  500 mg due to adverse effects, including extreme weakness and severe diarrhea. Her most recent A1c was 5.5.  She experiences severe headaches, described as 'killer headaches' that require her to go to bed. She is concerned about the financial stress of paying rent on a fixed income, which she believes may contribute to her headaches. She has a history of neck-related headaches and has not had an injection in her neck yet.  She mentions hip pain and the need for another injection in her left hip, which has not been done in years. Previous injections have provided significant relief for several months. Her shoulders are currently doing well after previous injections.  She reports no significant issues with sleep since starting phentermine , although she notes a change in her sleep pattern. Being retired, she can sleep at any time, which she finds beneficial. She feels more active and is engaged in deep cleaning her house, indicating improved mobility. She wants to start walking more, especially in [place] area, and has access to gym equipment through a  free membership.  Financial constraints impact her ability to purchase food, particularly protein sources. She relies on support from her brother and a close friend for food assistance. She plans to buy protein today, as it is payday, and regularly consumes Premier Protein shakes, which she finds affordable at [employer].     Past Medical History:  Diagnosis Date   Anterolisthesis    Anxiety    Arthritis    Depression    Difficult extubation 03/24/2022   a.) unexpected per anesthesia notes; declined neuraxial anesthetic course   DOE (dyspnea on exertion)    GERD (gastroesophageal reflux disease)    Headache    cervical   Hiatal hernia    HTN (hypertension)    Lumbar radiculopathy    PONV (postoperative nausea and vomiting)    Precancerous skin lesion (nose)    Prediabetes    Spinal stenosis     Medications: Outpatient Medications Prior to Visit  Medication Sig   fluticasone  (FLONASE ) 50 MCG/ACT nasal spray Place 2 sprays into both nostrils daily.   ibuprofen (ADVIL) 200 MG tablet Take 600 mg by mouth in the morning and at bedtime.   traMADol  (ULTRAM ) 50 MG tablet Take 1 tablet (50 mg total) by mouth every 12 (twelve) hours as needed.   [DISCONTINUED] phentermine  30 MG capsule Take 1 capsule (30 mg total) by mouth every morning.   [DISCONTINUED] metFORMIN  (GLUCOPHAGE ) 500 MG tablet Take 1 tablet (500 mg total) by mouth daily with  breakfast for 5 days, THEN 1 tablet (500 mg total) 2 (two) times daily with a meal for 5 days, THEN 2 tablets (1,000 mg total) daily with breakfast for 5 days, THEN 2 tablets (1,000 mg total) 2 (two) times daily with a meal for 15 days. (Patient not taking: No sig reported)   No facility-administered medications prior to visit.    Review of Systems  Last metabolic panel Lab Results  Component Value Date   GLUCOSE 90 09/13/2023   NA 142 09/13/2023   K 4.8 09/13/2023   CL 104 09/13/2023   CO2 22 09/13/2023   BUN 25 09/13/2023   CREATININE 0.97  09/13/2023   EGFR 64 09/13/2023   CALCIUM 9.6 09/13/2023   PROT 6.7 09/13/2023   ALBUMIN 4.2 09/13/2023   LABGLOB 2.5 09/13/2023   BILITOT 0.3 09/13/2023   ALKPHOS 117 09/13/2023   AST 18 09/13/2023   ALT 13 09/13/2023   ANIONGAP 11 11/26/2022   Last hemoglobin A1c Lab Results  Component Value Date   HGBA1C 5.5 09/13/2023     Lab Results  Component Value Date   CHOL 162 09/13/2023   HDL 32 (L) 09/13/2023   LDLCALC 111 (H) 09/13/2023   TRIG 100 09/13/2023   CHOLHDL 5.1 (H) 09/13/2023   The 10-year ASCVD risk score (Arnett DK, et al., 2019) is: 6.5%     Objective    BP 124/87 (BP Location: Left Arm, Patient Position: Sitting, Cuff Size: Large)   Pulse 80   Resp 20   Ht 5' 6 (1.676 m)   Wt 265 lb (120.2 kg) Comment: waist 49.5  BMI 42.77 kg/m  BP Readings from Last 3 Encounters:  12/14/23 124/87  10/26/23 132/83  09/13/23 (!) 140/80   Wt Readings from Last 3 Encounters:  12/14/23 265 lb (120.2 kg)  10/26/23 281 lb (127.5 kg)  09/13/23 278 lb (126.1 kg)        Physical Exam  Physical Exam VITALS: P- 80, BP- 124/87 MEASUREMENTS: Weight- 265. CHEST: Lungs clear to auscultation bilaterally. CARDIOVASCULAR: Heart sounds normal. Regular rate and rhythm. EXTREMITIES: No lower extremity edema. NEUROLOGICAL: Cranial nerves grossly intact, pupils reactive to light.    No results found for any visits on 12/14/23.  Assessment & Plan     Problem List Items Addressed This Visit       Other   BMI 40.0-44.9, adult (HCC) - Primary (Chronic)   Chronic, BMI 42.77 Obesity with recent weight loss from 281 to 265 pounds. Currently on phentermine  30 mg daily, which is well-tolerated and effective for weight loss. Metformin  500 mg was discontinued due to adverse effects including extreme weakness and violent diarrhea. A1c is normal at 5.5%. - Increase phentermine  to 37.5 mg once daily. - Encourage 30-minute walks four days a week. - Recommend resistance training or  weightlifting two days a week to prevent muscle loss. - Advise on maintaining adequate protein intake, considering financial constraints. - Encourage continued water  intake.      Relevant Medications   phentermine  37.5 MG capsule   Other Visit Diagnoses       Mild food insecurity         Acute non intractable tension-type headache            Assessment & Plan    Left hip pain Chronic left hip pain requiring periodic injections for relief. Previous injections have been effective for several months. - Refer to [facility] for [facility] injection.  Headaches Severe headaches reported, possibly related  to stress or medication. Described as pounding and severe enough to require bed rest. No recent headaches in the past few days. - Monitor for worsening headaches with increased phentermine  dose. - Advise to report if headaches worsen with increased dose.  Hyperlipidemia, not currently treated LDL was 111 mg/dL three months ago. Ten-year cardiovascular risk score is 6.5%, below the threshold for statin therapy initiation. Exercise and weight loss are expected to improve lipid profile. - Encourage exercise to improve lipid profile.     Return in about 8 weeks (around 02/07/2024) for as scheduled .         Rockie Agent, MD  [facility] [phone] (phone) [phone] (fax)  [facility]

## 2023-12-30 DIAGNOSIS — G8929 Other chronic pain: Secondary | ICD-10-CM | POA: Diagnosis not present

## 2023-12-30 DIAGNOSIS — M25552 Pain in left hip: Secondary | ICD-10-CM | POA: Diagnosis not present

## 2024-01-12 ENCOUNTER — Ambulatory Visit: Payer: Self-pay

## 2024-01-12 NOTE — Telephone Encounter (Signed)
 Copied from CRM [id number]. Topic: Clinical - Medical Advice >> Jan 12, 2024  1:35 PM Cleave MATSU wrote: Reason for CRM: pt said she wants to know what to do about the phentermine  because she's having constipation with that and valteren for arthiritis

## 2024-01-12 NOTE — Telephone Encounter (Signed)
 FYI Only or Action Required?: Action required by provider: Follow up call regarding symptoms and if she needs to stop taking her phentermine .  Patient was last seen in primary care on 12/14/2023 by Sharma Coyer, MD.  Called Nurse Triage reporting Constipation.  Symptoms began several weeks ago.  Interventions attempted: Rest, hydration, or home remedies.  Symptoms are: gradually worsening.  Triage Disposition: See Physician Within 24 Hours  Patient/caregiver understands and will follow disposition?: No, wishes to speak with PCP     Reason for Disposition  Last bowel movement (BM) > 4 days ago  Answer Assessment - Initial Assessment Questions Patient drinking water , coffee, and juice used for symptoms. Patient offered an appointment in office for symptoms and refused. Patient states she just wants to know if she should stop taking the phentermine  medication because she believes it is the cause of symptoms. Pt requesting a callback regarding her request.    1. STOOL PATTERN OR FREQUENCY: How often do you have a bowel movement (BM)?  (Normal range: 3 times a day to every 3 days)  When was your last BM?       Last BM was 4-5 days  2. STRAINING: Do you have to strain to have a BM?      Yes 3. ONSET: When did the constipation begin?     A few weeks ago  4. RECTAL PAIN: Does your rectum hurt when the stool comes out? If Yes, ask: Do you have hemorrhoids? How bad is the pain?  (Scale 1-10; or mild, moderate, severe)     Feels stopped up and bowels will not move  5. BM COMPOSITION: Are the stools hard?      Yes  7. CHRONIC CONSTIPATION: Is this a new problem for you?  If No, ask: How long have you had this problem? (days, weeks, months)      Denies but states she had some issues in the past after having surgery  8. CHANGES IN DIET OR HYDRATION: Have there been any recent changes in your diet? How much fluids are you drinking on a daily basis?  How much  have you had to drink today?    Attempting amount of water  she is drinking  9. MEDICINES: Have you been taking any new medicines? Are you taking any narcotic pain medicines? (e.g., Dilaudid , morphine, Percocet, Vicodin)     Phentermine  and Voltaren  10. LAXATIVES: Have you been using any stool softeners, laxatives, or enemas?  If Yes, ask What are you using, how often, and when was the last time?       Denies  12. CAUSE: What do you think is causing the constipation?        Medication change  14. OTHER SYMPTOMS: Do you have any other symptoms? (e.g., abdomen pain, bloating, fever, vomiting)       Denies  Protocols used: Constipation-A-AH

## 2024-01-13 NOTE — Telephone Encounter (Signed)
 Please hold phentermine  for 10 days and add twice daily miralax 

## 2024-01-13 NOTE — Telephone Encounter (Signed)
 Pt advised. Verbalized understanding.

## 2024-02-07 ENCOUNTER — Ambulatory Visit: Admitting: Family Medicine

## 2024-02-07 VITALS — BP 130/88 | HR 76 | Temp 98.8°F | Ht 66.0 in | Wt 262.3 lb

## 2024-02-07 DIAGNOSIS — Z6841 Body Mass Index (BMI) 40.0 and over, adult: Secondary | ICD-10-CM

## 2024-02-07 DIAGNOSIS — M5416 Radiculopathy, lumbar region: Secondary | ICD-10-CM

## 2024-02-07 DIAGNOSIS — I10 Essential (primary) hypertension: Secondary | ICD-10-CM

## 2024-02-07 DIAGNOSIS — M1712 Unilateral primary osteoarthritis, left knee: Secondary | ICD-10-CM

## 2024-02-07 DIAGNOSIS — M19011 Primary osteoarthritis, right shoulder: Secondary | ICD-10-CM

## 2024-02-07 DIAGNOSIS — M19012 Primary osteoarthritis, left shoulder: Secondary | ICD-10-CM | POA: Diagnosis not present

## 2024-02-07 DIAGNOSIS — Z96653 Presence of artificial knee joint, bilateral: Secondary | ICD-10-CM | POA: Diagnosis not present

## 2024-02-07 DIAGNOSIS — Z5941 Food insecurity: Secondary | ICD-10-CM

## 2024-02-07 DIAGNOSIS — Z0000 Encounter for general adult medical examination without abnormal findings: Secondary | ICD-10-CM | POA: Insufficient documentation

## 2024-02-07 DIAGNOSIS — G5702 Lesion of sciatic nerve, left lower limb: Secondary | ICD-10-CM | POA: Diagnosis not present

## 2024-02-07 DIAGNOSIS — Z780 Asymptomatic menopausal state: Secondary | ICD-10-CM | POA: Diagnosis not present

## 2024-02-07 DIAGNOSIS — Z0001 Encounter for general adult medical examination with abnormal findings: Secondary | ICD-10-CM | POA: Diagnosis not present

## 2024-02-07 MED ORDER — TIZANIDINE HCL 4 MG PO TABS
4.0000 mg | ORAL_TABLET | Freq: Four times a day (QID) | ORAL | 0 refills | Status: DC | PRN
Start: 2024-02-07 — End: 2024-02-29

## 2024-02-07 MED ORDER — TRAMADOL HCL 50 MG PO TABS: 50.0000 mg | ORAL_TABLET | Freq: Two times a day (BID) | ORAL | 0 refills | Status: DC | PRN

## 2024-02-07 NOTE — Progress Notes (Signed)
 Archer Vise                                          MRN: [id number]   02/07/2024   The [facility] Quality Team Specialist reviewed this patient medical record for the purposes of chart review for care gap closure. The following were reviewed: chart review for care gap closure-colorectal cancer screening.  Noncompliant FOBT 08/23/2022.    [facility] Quality Team

## 2024-02-07 NOTE — Progress Notes (Signed)
 Subjective:   Melissa [name] is a 68 y.o. female who presents for Medicare Annual (Subsequent) preventive examination.  Visit Complete: In person  Patient Medicare AWV questionnaire was completed by the patient on 10.14.25; I have confirmed that all information answered by patient is correct and no changes since this date.  Discussed the use of AI scribe software for clinical note transcription with the patient, who gave verbal consent to proceed.  History of Present Illness She has additional problems to discuss including her hip and back pain.   Melissa [name] is a 68 year old female with lumbar radiculopathy and obesity who presents for an annual wellness visit and physical exam.  She has been experiencing severe pain in her left buttock for the past two months due to lumbar radiculopathy, significantly impacting her ability to walk and sleep. A hip injection on September 5th did not alleviate her symptoms, and X-rays indicated worsening of her back condition. She has a history of back surgery. Recently, she tried tizanidine, which provided significant relief, and she is currently out of tramadol , which she takes for her lumbar radiculopathy.  She has a history of obesity with a BMI of 42 and has lost approximately 20 pounds since May, reducing her weight from 281 pounds to 262 pounds. She attributes this weight loss to regaining control over her appetite after discontinuing phentermine . She wants to walk without pain and mentions financial constraints affecting her ability to purchase food and exercise.  She reports food insecurity, living paycheck to paycheck, and previously received Meals on Wheels but canceled it, feeling others needed it more. She is grateful for her brother's occasional financial help and is interested in resources for food assistance.  She has a history of allergic rhinitis, generalized anxiety, osteoarthritis of bilateral shoulders, reactive depression, and is  status post knee replacement. She has not had a colonoscopy but has performed FIT tests, which have been normal. She declines recommendations for influenza, COVID, and hepatitis C vaccines.  She mentions a history of low HDL cholesterol, with a previous level of 32, and a normal A1c of 5.5. She has dentures and recently had them repaired. She uses readers for vision and has not had a formal eye exam recently. She is interested in starting a multivitamin.         Objective:    Today's Vitals   02/07/24 0853 02/07/24 0906 02/07/24 0946  BP:  (!) 138/99 130/88  Pulse:  76   Temp:  98.8 F (37.1 C)   TempSrc: Oral Oral   SpO2:  98%   Weight: 262 lb 4.8 oz (119 kg) 262 lb 4.8 oz (119 kg)   Height: 5' 6 (1.676 m) 5' 6 (1.676 m)   PainSc:  4     Body mass index is 42.34 kg/m.   Physical Exam Vitals reviewed.  Constitutional:      General: She is not in acute distress.    Appearance: Normal appearance. She is not ill-appearing, toxic-appearing or diaphoretic.  HENT:     Head: Normocephalic and atraumatic.     Right Ear: Tympanic membrane and external ear normal. There is no impacted cerumen.     Left Ear: Tympanic membrane and external ear normal. There is no impacted cerumen.     Nose: Nose normal.     Mouth/Throat:     Pharynx: Oropharynx is clear.  Eyes:     General: No scleral icterus.    Extraocular Movements: Extraocular movements intact.  Conjunctiva/sclera: Conjunctivae normal.     Pupils: Pupils are equal, round, and reactive to light.  Cardiovascular:     Rate and Rhythm: Normal rate and regular rhythm.     Pulses: Normal pulses.     Heart sounds: Normal heart sounds. No murmur heard.    No friction rub. No gallop.  Pulmonary:     Effort: Pulmonary effort is normal. No respiratory distress.     Breath sounds: Normal breath sounds. No wheezing, rhonchi or rales.  Abdominal:     General: Bowel sounds are normal. There is no distension.     Palpations: Abdomen  is soft. There is no mass.     Tenderness: There is no abdominal tenderness. There is no guarding.  Musculoskeletal:        General: No deformity.     Cervical back: Normal range of motion and neck supple.     Right lower leg: No edema.     Left lower leg: No edema.     Comments: Tenderness in left buttock region, antalgic gait,   Lymphadenopathy:     Cervical: No cervical adenopathy.  Skin:    General: Skin is warm.     Capillary Refill: Capillary refill takes less than 2 seconds.     Findings: No erythema or rash.  Neurological:     General: No focal deficit present.     Mental Status: She is alert and oriented to person, place, and time.     Cranial Nerves: Cranial nerves 2-12 are intact. No cranial nerve deficit or facial asymmetry.     Motor: Motor function is intact. No weakness.     Gait: Gait normal.  Psychiatric:        Mood and Affect: Mood normal.        Behavior: Behavior normal.         12/03/2022    6:19 AM 11/26/2022    8:24 AM 03/24/2022    4:33 PM 03/15/2022   11:08 AM 09/12/2019   10:51 PM 09/12/2019   12:29 PM 09/03/2019    8:46 AM  Advanced Directives  Does Patient Have a Medical Advance Directive? No No No No No No No  Does patient want to make changes to medical advance directive? No - Patient declined No - Patient declined       Would patient like information on creating a medical advance directive? No - Patient declined Yes (MAU/Ambulatory/Procedural Areas - Information given) No - Patient declined  Yes (Inpatient - patient requests chaplain consult to create a medical advance directive) No - Patient declined No - Patient declined    Current Medications (verified) Outpatient Encounter Medications as of 02/07/2024  Medication Sig   diclofenac (VOLTAREN) 50 MG EC tablet Take 50 mg by mouth 2 (two) times daily with a meal.   fluticasone  (FLONASE ) 50 MCG/ACT nasal spray Place 2 sprays into both nostrils daily.   ibuprofen (ADVIL) 200 MG tablet Take 600 mg  by mouth in the morning and at bedtime.   tiZANidine (ZANAFLEX) 4 MG tablet Take 1 tablet (4 mg total) by mouth every 6 (six) hours as needed for muscle spasms.   [DISCONTINUED] traMADol  (ULTRAM ) 50 MG tablet Take 1 tablet (50 mg total) by mouth every 12 (twelve) hours as needed.   traMADol  (ULTRAM ) 50 MG tablet Take 1 tablet (50 mg total) by mouth every 12 (twelve) hours as needed.   [DISCONTINUED] phentermine  37.5 MG capsule Take 1 capsule (37.5 mg total) by mouth every morning. (  Patient not taking: Reported on 02/07/2024)   No facility-administered encounter medications on file as of 02/07/2024.    Allergies (verified) Celecoxib    History: Past Medical History:  Diagnosis Date   Anterolisthesis    Anxiety    Arthritis    Depression    Difficult extubation 03/24/2022   a.) unexpected per anesthesia notes; declined neuraxial anesthetic course   DOE (dyspnea on exertion)    GERD (gastroesophageal reflux disease)    Headache    cervical   Hiatal hernia    HTN (hypertension)    Lumbar radiculopathy    PONV (postoperative nausea and vomiting)    Precancerous skin lesion (nose)    Prediabetes    Spinal stenosis    Past Surgical History:  Procedure Laterality Date   BACK SURGERY     lumbar area   JOINT REPLACEMENT  03/24/2022, 12/03/2022   KNEE ARTHROPLASTY Right 03/24/2022   Procedure: COMPUTER ASSISTED TOTAL KNEE ARTHROPLASTY;  Surgeon: Mardee Lynwood SQUIBB, MD;  Location: [facility] ORS;  Service: Orthopedics;  Laterality: Right;   KNEE ARTHROPLASTY Left 12/03/2022   Procedure: COMPUTER ASSISTED TOTAL KNEE ARTHROPLASTY;  Surgeon: Mardee Lynwood SQUIBB, MD;  Location: [facility] ORS;  Service: Orthopedics;  Laterality: Left;   SPINE SURGERY  08/2019   WISDOM TOOTH EXTRACTION     Family History  Problem Relation Age of Onset   Stroke Mother    Dementia Mother    Anxiety disorder Mother    Depression Mother    Congestive Heart Failure Father    Hearing loss Father    Vision loss Father     Hypertension Maternal Grandmother    Alcohol abuse Paternal Grandfather    Arthritis Paternal Grandmother    Cancer Maternal Uncle    Obesity Maternal Aunt    Varicose Veins Maternal Aunt    Breast cancer Neg Hx    Social History   Socioeconomic History   Marital status: Widowed    Spouse name: Not on file   Number of children: Not on file   Years of education: Not on file   Highest education level: 12th grade  Occupational History   Not on file  Tobacco Use   Smoking status: Former    Current packs/day: 0.00    Types: Cigarettes    Quit date: 2006    Years since quitting: 19.7   Smokeless tobacco: Never  Vaping Use   Vaping status: Never Used  Substance and Sexual Activity   Alcohol use: No   Drug use: No   Sexual activity: Not Currently  Other Topics Concern   Not on file  Social History Narrative   Lives alone   Social Drivers of Health   Financial Resource Strain: Medium Risk (02/07/2024)   Overall Financial Resource Strain (CARDIA)    Difficulty of Paying Living Expenses: Somewhat hard  Food Insecurity: Food Insecurity Present (02/07/2024)   Hunger Vital Sign    Worried About Running Out of Food in the Last Year: Often true    Ran Out of Food in the Last Year: Often true  Transportation Needs: No Transportation Needs (02/07/2024)   PRAPARE - Administrator, Civil Service (Medical): No    Lack of Transportation (Non-Medical): No  Physical Activity: Inactive (02/07/2024)   Exercise Vital Sign    Days of Exercise per Week: 0 days    Minutes of Exercise per Session: Not on file  Stress: Stress Concern Present (02/07/2024)   [employer] of Occupational Health - Occupational  Stress Questionnaire    Feeling of Stress: To some extent  Social Connections: Socially Isolated (02/07/2024)   Social Connection and Isolation Panel    Frequency of Communication with Friends and Family: More than three times a week    Frequency of Social Gatherings with  Friends and Family: Once a week    Attends Religious Services: Never    Database administrator or Organizations: No    Attends Engineer, structural: Not on file    Marital Status: Widowed    Tobacco Counseling Counseling given: Not Answered   Clinical Intake:  Pre-visit preparation completed: Yes  Pain : 0-10 Pain Score: 4  Pain Type: Chronic pain Pain Location: Buttocks Pain Orientation: Left Pain Radiating Towards: down leg Pain Descriptors / Indicators: Constant, Discomfort Pain Onset: More than a month ago Pain Frequency: Occasional     Nutritional Status: BMI > 30  Obese Diabetes: No  How often do you need to have someone help you when you read instructions, pamphlets, or other written materials from your doctor or pharmacy?: 1 - Never  Interpreter Needed?: No      Activities of Daily Living    02/03/2024    9:43 AM  In your present state of health, do you have any difficulty performing the following activities:  Hearing? 0  Vision? 0  Difficulty concentrating or making decisions? 0  Walking or climbing stairs? 1  Dressing or bathing? 0  Doing errands, shopping? 0  Preparing Food and eating ? N  Using the Toilet? N  In the past six months, have you accidently leaked urine? Y  Do you have problems with loss of bowel control? Y  Managing your Medications? N  Managing your Finances? N  Housekeeping or managing your Housekeeping? Y    Patient Care Team: Sharma Coyer, MD as PCP - General (Family Medicine)  Indicate any recent Medical Services you may have received from other than [facility] providers in the past year (date may be approximate).     Assessment:   This is a routine wellness examination for Melissa [name].  Hearing/Vision screen No results found.   Goals Addressed   None    Depression Screen    02/07/2024    9:03 AM 12/14/2023    9:19 AM 10/26/2023    3:23 PM 09/13/2023    2:50 PM  PHQ 2/9 Scores  PHQ - 2 Score 0 0 3 4   PHQ- 9 Score 2 3 7 15     Fall Risk    02/07/2024    9:03 AM 02/03/2024    9:43 AM  Fall Risk   Falls in the past year? 0 0  Number falls in past yr: 0 0  Injury with Fall? 0 0  Risk for fall due to : No Fall Risks   Follow up Falls evaluation completed     MEDICARE RISK AT HOME: Medicare Risk at Home Any stairs in or around the home?: (Patient-Rptd) Yes If so, are there any without handrails?: (Patient-Rptd) No Home free of loose throw rugs in walkways, pet beds, electrical cords, etc?: (Patient-Rptd) Yes Adequate lighting in your home to reduce risk of falls?: (Patient-Rptd) Yes Life alert?: (Patient-Rptd) No Use of a cane, walker or w/c?: (Patient-Rptd) Yes Grab bars in the bathroom?: (Patient-Rptd) No Shower chair or bench in shower?: (Patient-Rptd) No Elevated toilet seat or a handicapped toilet?: (Patient-Rptd) No  TIMED UP AND GO:  Was the test performed?  No    Cognitive Function:  02/07/2024    9:09 AM  6CIT Screen  What Year? 0 points  What month? 0 points  What time? 0 points  Count back from 20 0 points  Months in reverse 0 points  Repeat phrase 0 points  Total Score 0 points    Immunizations Immunization History  Administered Date(s) Administered   Influenza Inj Mdck Quad Pf 01/18/2017, 01/23/2019, 02/04/2021, 02/04/2022   Influenza,inj,Quad PF,6+ Mos 02/05/2020   Influenza-Unspecified 01/19/2017   PFIZER Comirnaty(Gray Top)Covid-19 Tri-Sucrose Vaccine 06/25/2019, 07/16/2019   PNEUMOCOCCAL CONJUGATE-20 08/13/2022   Tdap 01/18/2017    TDAP status: Up to date  Flu Vaccine status: Declined, Education has been provided regarding the importance of this vaccine but patient still declined. Advised may receive this vaccine at local pharmacy or Health Dept. Aware to provide a copy of the vaccination record if obtained from local pharmacy or Health Dept. Verbalized acceptance and understanding.  Pneumococcal vaccine status: Up to  date  Covid-19 vaccine status: Declined, Education has been provided regarding the importance of this vaccine but patient still declined. Advised may receive this vaccine at local pharmacy or Health Dept.or vaccine clinic. Aware to provide a copy of the vaccination record if obtained from local pharmacy or Health Dept. Verbalized acceptance and understanding.  Qualifies for Shingles Vaccine? Yes   Zostavax completed No   Shingrix Completed?: No.    Education has been provided regarding the importance of this vaccine. Patient has been advised to call insurance company to determine out of pocket expense if they have not yet received this vaccine. Advised may also receive vaccine at local pharmacy or Health Dept. Verbalized acceptance and understanding.  Screening Tests Health Maintenance  Topic Date Due   Zoster Vaccines- Shingrix (1 of 2) Never done   DEXA SCAN  Never done   COVID-19 Vaccine (3 - 2025-26 season) 02/23/2024 (Originally 12/26/2023)   Influenza Vaccine  07/24/2024 (Originally 11/25/2023)   Colonoscopy  02/06/2025 (Originally 03/01/2002)   Hepatitis C Screening  02/06/2025 (Originally [birth year]02/1975)   Medicare Annual Wellness (AWV)  02/06/2025   Mammogram  10/03/2025   DTaP/Tdap/Td (2 - Td or Tdap) 01/19/2027   Pneumococcal Vaccine: 50+ Years  Completed   Meningococcal B Vaccine  Aged Out    Health Maintenance  Health Maintenance Due  Topic Date Due   Zoster Vaccines- Shingrix (1 of 2) Never done   DEXA SCAN  Never done    COLORECTAL CA screen: recommended, pt declines today   Mammogram status: Completed 2025. Repeat every year  Bone Density status: Completed 2018. Results reflect: Bone density results: NORMAL. Repeat every 2 years.  Lung Cancer Screening: (Low Dose CT Chest recommended if Age 72-80 years, 20 pack-year currently smoking OR have quit w/in 15years.) does not qualify.    Additional Screening:  Hepatitis C Screening: does qualify; postponed  Vision  Screening: Recommended annual ophthalmology exams for early detection of glaucoma and other disorders of the eye. Is the patient up to date with their annual eye exam?  No   If pt is not established with a provider, would they like to be referred to a provider to establish care? No .   Dental Screening: Recommended annual dental exams for proper oral hygiene   Community Resource Referral / Chronic Care Management: CRR required this visit?  Yes   CCM required this visit?  No     Plan:     I have personally reviewed and noted the following in the patient's chart:   Medical and social  history Use of alcohol, tobacco or illicit drugs  Current medications and supplements including opioid prescriptions. Patient is not currently taking opioid prescriptions. Functional ability and status Nutritional status Physical activity Advanced directives List of other physicians Hospitalizations, surgeries, and ER visits in previous 12 months Vitals Screenings to include cognitive, depression, and falls Referrals and appointments  In addition, I have reviewed and discussed with patient certain preventive protocols, quality metrics, and best practice recommendations. A written personalized care plan for preventive services as well as general preventive health recommendations were provided to patient.    Assessment & Plan Lumbar radiculopathy with left-sided sciatica Chronic lumbar radiculopathy with severe pain in left buttock extending down the leg. Pain improved with tizanidine. Currently out of tramadol  for pain management. - Refill tramadol  50 mg every 12 hours  Obesity Obesity with BMI of 42. Lost approximately 20 pounds since last visit. Previously on phentermine , which helped control appetite. No longer taking phentermine  but maintaining weight loss.  Oral candidiasis (thrush) Presence of oral thrush. - Advise brushing tongue thoroughly - Recommend using gold Listerine for oral  hygiene  Food insecurity Experiencing food insecurity, managing on a limited budget. Previously declined Meals on Wheels due to feeling others were more in need. - Refer to [facility] for food insecurity - Provided food pantry bag  Osteoarthritis of bilateral shoulders Osteoarthritis in bilateral shoulders. Received injections in the past.  Status post bilateral knee replacement for osteoarthritis -f/u with orthopedics   Allergic rhinitis Allergic rhinitis, currently using Flonase .  Generalized anxiety disorder and depression Generalized anxiety disorder and depression with no changes in management.  Adult Wellness Visit Annual wellness visit conducted. Declined influenza, COVID, and hepatitis C vaccines. Declined colon cancer screening. Recommended shingles vaccine and bone density scan. - Recommend shingles vaccine - Order bone density scan    Rockie Agent, MD   02/07/2024

## 2024-02-07 NOTE — Patient Instructions (Signed)
 It was a pleasure to see you today!  [facility] [address] [place],  [place]  [address] Main: [phone]   Thank you for choosing [facility] for your primary care.   Today you were seen for your annual physical  Please review the attached information regarding helpful preventive health topics.   To keep you healthy, please keep in mind the following health maintenance items that you are due for:   Health Maintenance Due  Topic Date Due   Zoster Vaccines- Shingrix (1 of 2) Never done   DEXA SCAN  Never done     Best Wishes,   Dr. Lang  To keep you healthy, please keep in mind the following health maintenance items that you are due for:   Health Maintenance Due  Topic Date Due   Zoster Vaccines- Shingrix (1 of 2) Never done   DEXA SCAN  Never done     Best Wishes,   Dr. Lang

## 2024-02-28 ENCOUNTER — Other Ambulatory Visit: Payer: Self-pay | Admitting: Family Medicine

## 2024-02-28 ENCOUNTER — Ambulatory Visit: Payer: Self-pay

## 2024-02-28 DIAGNOSIS — G5702 Lesion of sciatic nerve, left lower limb: Secondary | ICD-10-CM

## 2024-02-28 NOTE — Telephone Encounter (Signed)
 Patient call note and symptoms reviewed. Agree with scheduled appt. Will evaluate during OV

## 2024-02-28 NOTE — Telephone Encounter (Signed)
 FYI Only or Action Required?: FYI only for provider: appointment scheduled on 11.5.25.  Patient was last seen in primary care on 02/07/2024 by Sharma Coyer, MD.  Called Nurse Triage reporting Back Pain.  Symptoms began several weeks ago.  Interventions attempted: OTC medications: voltaren gel, Prescription medications: tramadol , muscle relaxer, voltaren pills, Rest, hydration, or home remedies, and Ice/heat application.  Symptoms are: gradually worsening.  Triage Disposition: See Physician Within 24 Hours  Patient/caregiver understands and will follow disposition?: Yes    Copied from CRM [id number]. Topic: Clinical - Red Word Triage >> Feb 28, 2024 10:09 AM Donee H wrote: [facility] that prompted transfer to Nurse Triage: Patient called to place a medication refill request but is complaining about being in pain. She states current pain is tolerable but she just experience so much pain in back all of time. She is also concern with leg being numb. She states the pain shots through leg and ankles which becomes numb. The numbness part is something that started and she states haven't experienced that before. She currently has a heating pad on it. Reason for Disposition  Numbness in a leg or foot (i.e., loss of sensation)  Answer Assessment - Initial Assessment Questions PT states she has been having this left buttcheek pain that goes down  her left leg. She states she is concerned bc she has new symptoms of numbness down that leg. She can feel the numbness from her hip to her foot. States it is also weaker. 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      1. ONSET: When did the pain begin? (e.g., minutes, hours, days)     States a couple of months but worse the last couple of weeks 2. LOCATION: Where does it hurt? (upper, mid or lower back)     Left buttcheek 3. SEVERITY: How bad is the pain?  (e.g., Scale 1-10; mild, moderate,  or severe)     6 4. PATTERN: Is the pain constant? (e.g., yes, no; constant, intermittent)      Constant but some days worse 5. RADIATION: Does the pain shoot into your legs or somewhere else?     Down the left leg 6. CAUSE:  What do you think is causing the back pain?      Sciatic nerve 7. BACK OVERUSE:  Any recent lifting of heavy objects, strenuous work or exercise?     Yes decorating  8. MEDICINES: What have you taken so far for the pain? (e.g., nothing, acetaminophen , NSAIDS)     Tramadol  and muscle relaxer, Voltaren gel and pills, heating pad 9. NEUROLOGIC SYMPTOMS: Do you have any weakness, numbness, or problems with bowel/bladder control?     Started to have weakness and numbness in the left lefg 10. OTHER SYMPTOMS: Do you have any other symptoms? (e.g., fever, abdomen pain, burning with urination, blood in urine)       no  Protocols used: Back Pain-A-AH

## 2024-02-28 NOTE — Telephone Encounter (Signed)
 Copied from CRM [id number]. Topic: Clinical - Medication Refill >> Feb 28, 2024 10:02 AM Donee H wrote: Medication: tiZANidine (ZANAFLEX) 4 MG tablet  Has the patient contacted their pharmacy? No   This is the patient's preferred pharmacy:  [facility] - [place], [place] - [address] [place] [address] Phone: [phone] Fax: [phone]  Is this the correct pharmacy for this prescription? Yes If no, delete pharmacy and type the correct one.   Has the prescription been filled recently? No  Is the patient out of the medication? No, but has about 5 tablets left    Has the patient been seen for an appointment in the last year OR does the patient have an upcoming appointment? Yes  Can we respond through MyChart? Yes  Agent: Please be advised that Rx refills may take up to 3 business days. We ask that you follow-up with your pharmacy.

## 2024-02-29 ENCOUNTER — Encounter: Payer: Self-pay | Admitting: Family Medicine

## 2024-02-29 ENCOUNTER — Ambulatory Visit: Admitting: Family Medicine

## 2024-02-29 VITALS — BP 129/77 | HR 73 | Ht 66.0 in | Wt 261.7 lb

## 2024-02-29 DIAGNOSIS — M5442 Lumbago with sciatica, left side: Secondary | ICD-10-CM | POA: Diagnosis not present

## 2024-02-29 DIAGNOSIS — M545 Low back pain, unspecified: Secondary | ICD-10-CM

## 2024-02-29 MED ORDER — TIZANIDINE HCL 4 MG PO TABS: 4.0000 mg | ORAL_TABLET | Freq: Four times a day (QID) | ORAL | 0 refills | Status: DC | PRN

## 2024-02-29 MED ORDER — PREDNISONE 20 MG PO TABS: 40.0000 mg | ORAL_TABLET | Freq: Every day | ORAL | 0 refills | Status: AC

## 2024-02-29 MED ORDER — METHYLPREDNISOLONE ACETATE 40 MG/ML IJ SUSP
40.0000 mg | Freq: Once | INTRAMUSCULAR | Status: AC
  Administered 2024-02-29: 40 mg via INTRAMUSCULAR

## 2024-02-29 NOTE — Telephone Encounter (Signed)
 Requested medication (s) are due for refill today - no  Requested medication (s) are on the active medication list -yes  Future visit scheduled -yes  Last refill: 02/29/24 #30  Notes to clinic: non delegated Rx, duplicate request  Requested Prescriptions  Pending Prescriptions Disp Refills   tiZANidine (ZANAFLEX) 4 MG tablet 30 tablet 0    Sig: Take 1 tablet (4 mg total) by mouth every 6 (six) hours as needed for muscle spasms.     Not Delegated - Cardiovascular:  Alpha-2 [facility]onists - tizanidine Failed - 02/29/2024  2:15 PM      Failed - This refill cannot be delegated      Passed - Valid encounter within last 6 months    Recent Outpatient Visits           Today Acute left-sided low back pain with left-sided sciatica   [place] [facility] Simmons-Robinson, [place], MD   3 weeks ago Encounter for annual wellness visit (AWV) in Medicare patient   [place] [facility] Simmons-Robinson, [place], MD   2 months ago BMI 40.0-44.9, adult [facility])   [place] [facility] Simmons-Robinson, [place], MD   4 months ago BMI 40.0-44.9, adult (HCC)   [place] [facility] Simmons-Robinson, [place], MD   5 months ago Class 3 severe obesity with serious comorbidity and body mass index (BMI) of 40.0 to 44.9 in adult, unspecified obesity type   [place] [facility] Simmons-Robinson, [place], MD                 Requested Prescriptions  Pending Prescriptions Disp Refills   tiZANidine (ZANAFLEX) 4 MG tablet 30 tablet 0    Sig: Take 1 tablet (4 mg total) by mouth every 6 (six) hours as needed for muscle spasms.     Not Delegated - Cardiovascular:  Alpha-2 Agonists - tizanidine Failed - 02/29/2024  2:15 PM      Failed - This refill cannot be delegated      Passed - Valid encounter within last 6 months    Recent Outpatient Visits           Today Acute left-sided low back pain with left-sided sciatica    [place] [facility] Simmons-Robinson, Rock[place], MD   3 weeks ago Encounter for annual wellness visit (AWV) in Medicare patient   [place] [facility] [place], [place], MD   2 months ago BMI 40.0-44.9, adult [facility])   [place] [facility] Simmons-Robinson, [place], MD   4 months ago BMI 40.0-44.9, adult [facility])    [facility] Simmons-Robinson, [place], MD   5 months ago Class 3 severe obesity with serious comorbidity and body mass index (BMI) of 40.0 to 44.9 in adult, unspecified obesity type   [facility] Sharma Rockie, MD
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
## 2024-03-26 ENCOUNTER — Ambulatory Visit: Payer: Self-pay

## 2024-03-26 NOTE — Telephone Encounter (Signed)
 FYI Only or Action Required?: Action required by provider: Please call to schedule for steroid injection if insurance covers- last inj 11/5.  Patient was last seen in primary care on 02/29/2024 by Sharma Coyer, MD.  Called Nurse Triage reporting Back Pain.  Symptoms began several months ago.  Interventions attempted: Prescription medications: prednisone .  Symptoms are: gradually worsening.  Triage Disposition: See PCP When Office is Open (Within 3 Days)  Patient/caregiver understands and will follow disposition?: No, wishes to speak with PCP  Copied from CRM #[id number]. Topic: Clinical - Medical Advice >> Mar 26, 2024 11:48 AM Ahlexyia S wrote: Reason for CRM: Pt called in wanting to see if she can receive the prednisone  shot again. Pt stated she received the shot and prescribed the medication on 11/05. Per pt chart pt was prescribed the medication for 5 days and this med expired on 11/12. Pt would like a callback for this. Pt has been advised of turnaround time for callbacks. Reason for Disposition  [1] Pain radiates into the thigh or further down the leg AND [2] one leg  Answer Assessment - Initial Assessment Questions Steroid injection 11/5- helped completely got rid of pain-  Pain has started back and worse in bed. No new concerning symptoms. Patient is wanting to try to hold off until after the holidays to see her surgeon.  Advised insurance sometimes does not cover in such a short time span but will check with the office. She Understands. ED/UC precautions advised.  Please reach out to patient to schedule  1. ONSET: When did the pain begin? (e.g., minutes, hours, days)     Ongoing back pain  2. LOCATION: Where does it hurt? (upper, mid or lower back)     Left butt cheek  3. SEVERITY: How bad is the pain?  (e.g., Scale 1-10; mild, moderate, or severe)     5/10 up and about - laying down 8/10 4. PATTERN: Is the pain constant? (e.g., yes, no; constant,  intermittent)      Fluctuated in intensity  5. RADIATION: Does the pain shoot into your legs or somewhere else?     Down the leg and numb 6. CAUSE:  What do you think is causing the back pain?      Chronic  7. BACK OVERUSE:  Any recent lifting of heavy objects, strenuous work or exercise?     denies 8. MEDICINES: What have you taken so far for the pain? (e.g., nothing, acetaminophen , NSAIDS)     Took prednisone  5 day course after prednisone  IM  9. NEUROLOGIC SYMPTOMS: Do you have any weakness, numbness, or problems with bowel/bladder control?     denies 10. OTHER SYMPTOMS: Do you have any other symptoms? (e.g., fever, abdomen pain, burning with urination, blood in urine)  Protocols used: Back Pain-A-AH

## 2024-03-28 NOTE — Telephone Encounter (Signed)
Patient scheduled for 1/28 at 8 am.

## 2024-03-28 NOTE — Telephone Encounter (Signed)
 Please schedule patient for office visit for steroid injection to help with inflammation

## 2024-04-02 ENCOUNTER — Ambulatory Visit: Admitting: Family Medicine

## 2024-04-02 ENCOUNTER — Encounter: Payer: Self-pay | Admitting: Family Medicine

## 2024-04-02 VITALS — BP 120/90 | HR 77 | Ht 66.0 in | Wt 258.4 lb

## 2024-04-02 DIAGNOSIS — M19011 Primary osteoarthritis, right shoulder: Secondary | ICD-10-CM

## 2024-04-02 DIAGNOSIS — M5442 Lumbago with sciatica, left side: Secondary | ICD-10-CM | POA: Diagnosis not present

## 2024-04-02 DIAGNOSIS — M19012 Primary osteoarthritis, left shoulder: Secondary | ICD-10-CM

## 2024-04-02 DIAGNOSIS — M1712 Unilateral primary osteoarthritis, left knee: Secondary | ICD-10-CM

## 2024-04-02 DIAGNOSIS — Z96653 Presence of artificial knee joint, bilateral: Secondary | ICD-10-CM | POA: Diagnosis not present

## 2024-04-02 DIAGNOSIS — M5416 Radiculopathy, lumbar region: Secondary | ICD-10-CM | POA: Diagnosis not present

## 2024-04-02 DIAGNOSIS — I10 Essential (primary) hypertension: Secondary | ICD-10-CM

## 2024-04-02 MED ORDER — TIZANIDINE HCL 4 MG PO TABS: 4.0000 mg | ORAL_TABLET | Freq: Four times a day (QID) | ORAL | 2 refills | Status: AC | PRN

## 2024-04-02 MED ORDER — TRAMADOL HCL 50 MG PO TABS: 50.0000 mg | ORAL_TABLET | Freq: Two times a day (BID) | ORAL | 0 refills | Status: DC | PRN

## 2024-04-02 MED ORDER — PREDNISONE 20 MG PO TABS: 40.0000 mg | ORAL_TABLET | Freq: Every day | ORAL | 0 refills | Status: AC

## 2024-04-02 MED ORDER — METHYLPREDNISOLONE ACETATE 40 MG/ML IJ SUSP
40.0000 mg | Freq: Once | INTRAMUSCULAR | Status: AC
  Administered 2024-04-02: 40 mg via INTRAMUSCULAR

## 2024-04-02 NOTE — Patient Instructions (Signed)
 To keep you healthy, please keep in mind the following health maintenance items that you are due for:   Health Maintenance Due  Topic Date Due   Zoster Vaccines- Shingrix (1 of 2) Never done     Best Wishes,   Dr. Lang

## 2024-04-02 NOTE — Assessment & Plan Note (Signed)
 Chronic  Blood pressure recorded at 120/90 mmHg, with the diastolic pressure slightly elevated. She is not on antihypertensive medication and attributes the elevation to pain. - CTM BP  - no current antihypertensives

## 2024-04-02 NOTE — Progress Notes (Signed)
 Established patient visit   Patient: Melissa [name]   DOB: 1956-03-19   68 y.o. Female  MRN: [id number] Visit Date: 04/02/2024  Today's healthcare provider: Rockie Agent, MD   Chief Complaint  Patient presents with   Back Pain    Patient c/o back pain x 2 weeks, patient reports this is the same as last time in the right lower back down the buttock, all the way down to the right heel    Subjective     HPI     Back Pain    Additional comments: Patient c/o back pain x 2 weeks, patient reports this is the same as last time in the right lower back down the buttock, all the way down to the right heel       Last edited by Cherry Chiquita [name], CMA on 04/02/2024  8:01 AM.       Discussed the use of AI scribe software for clinical note transcription with the patient, who gave verbal consent to proceed.  History of Present Illness Melissa [name] is a 68 year old female who presents with worsening right-sided sciatica.  She has been experiencing acute left-sided sciatica with pain radiating from her hip down to her foot, which has progressively worsened over the past two weeks. Recently, she developed a new sensation in her heel that feels like stepping on a rock, affecting her ability to walk barefoot and perform daily activities.  Initially, her symptoms improved with a previous course of prednisone  and a steroid injection, which provided significant relief. However, the symptoms have returned with increased intensity.  She recently attempted a part-time job delivering medicine, which involved significant physical activity, including climbing stairs. She found the job too physically demanding due to her back pain and had to stop after five days.  She is currently taking tizanidine  4 mg every six hours as needed and tramadol  50 mg every twelve hours for pain management. The muscle relaxer helps her get comfortable in bed, but she has only one dose left of each  medication.  She reports left-sided pain radiating to her heel, a prickly sensation, and numbness. No recent injury or fall.     Past Medical History:  Diagnosis Date   Anterolisthesis    Anxiety    Arthritis    Depression    Difficult extubation 03/24/2022   a.) unexpected per anesthesia notes; declined neuraxial anesthetic course   DOE (dyspnea on exertion)    GERD (gastroesophageal reflux disease)    Headache    cervical   Hiatal hernia    HTN (hypertension)    Lumbar radiculopathy    PONV (postoperative nausea and vomiting)    Precancerous skin lesion (nose)    Prediabetes    Spinal stenosis     Medications: Outpatient Medications Prior to Visit  Medication Sig   diclofenac (VOLTAREN) 50 MG EC tablet Take 50 mg by mouth 2 (two) times daily with a meal.   fluticasone  (FLONASE ) 50 MCG/ACT nasal spray Place 2 sprays into both nostrils daily.   ibuprofen (ADVIL) 200 MG tablet Take 600 mg by mouth in the morning and at bedtime.   [DISCONTINUED] tiZANidine  (ZANAFLEX ) 4 MG tablet Take 1 tablet (4 mg total) by mouth every 6 (six) hours as needed for muscle spasms.   [DISCONTINUED] traMADol  (ULTRAM ) 50 MG tablet Take 1 tablet (50 mg total) by mouth every 12 (twelve) hours as needed.   No facility-administered medications prior to visit.  Review of Systems  Last metabolic panel Lab Results  Component Value Date   GLUCOSE 90 09/13/2023   NA 142 09/13/2023   K 4.8 09/13/2023   CL 104 09/13/2023   CO2 22 09/13/2023   BUN 25 09/13/2023   CREATININE 0.97 09/13/2023   EGFR 64 09/13/2023   CALCIUM 9.6 09/13/2023   PROT 6.7 09/13/2023   ALBUMIN 4.2 09/13/2023   LABGLOB 2.5 09/13/2023   BILITOT 0.3 09/13/2023   ALKPHOS 117 09/13/2023   AST 18 09/13/2023   ALT 13 09/13/2023   ANIONGAP 11 11/26/2022   No results found.       Objective    BP (!) 120/90 (BP Location: Right Arm, Patient Position: Sitting, Cuff Size: Normal)   Pulse 77   Ht 5' 6 (1.676 m)   Wt  258 lb 6.4 oz (117.2 kg)   SpO2 98%   BMI 41.71 kg/m  BP Readings from Last 3 Encounters:  04/02/24 (!) 120/90  02/29/24 129/77  02/07/24 130/88   Wt Readings from Last 3 Encounters:  04/02/24 258 lb 6.4 oz (117.2 kg)  02/29/24 261 lb 11.2 oz (118.7 kg)  02/07/24 262 lb 4.8 oz (119 kg)        Physical Exam  Physical Exam VITALS: BP- 120/90 GENERAL: Pleasant, ambulates independently with an antalgic gait. MUSCULOSKELETAL: Weakness in left hip flexion. Positive straight leg raise on left, negative on right. Pain with range of motion in left lower extremity. 3/5 strength in left lower extremity, 5/5 on right. NEUROLOGICAL: Normal sensation to light touch in bilateral lower extremities.    No results found for any visits on 04/02/24.  Assessment & Plan     Problem List Items Addressed This Visit     Essential hypertension   Chronic  Blood pressure recorded at 120/90 mmHg, with the diastolic pressure slightly elevated. She is not on antihypertensive medication and attributes the elevation to pain. - CTM BP  - no current antihypertensives       Lumbar radiculopathy   Lumbar radiculopathy with left-sided sciatica Acute exacerbation of left-sided sciatica with pain radiating from the left buttock to the heel, accompanied by numbness and a prickly sensation. Positive straight leg raise test on the left side and weakness in the left lower extremity with strength of 3/5. Previous treatment with prednisone  and tizanidine  provided relief, but symptoms have returned. She is eager to avoid surgery, especially around the Christmas period. - Administered 40 mg dose of doprimedrol injection. - Prescribed prednisone  for one week. - Refilled tizanidine  4 mg every six hours as needed. - Refilled tramadol  50 mg every twelve hours. - Encouraged scheduling an appointment with neurosurgery, preferably before the new year.      Relevant Medications   methylPREDNISolone  acetate (DEPO-MEDROL )  injection 40 mg   predniSONE  (DELTASONE ) 20 MG tablet (Start on 04/03/2024)   tiZANidine  (ZANAFLEX ) 4 MG tablet   Primary osteoarthritis of both shoulders   Relevant Medications   methylPREDNISolone  acetate (DEPO-MEDROL ) injection 40 mg   predniSONE  (DELTASONE ) 20 MG tablet (Start on 04/03/2024)   tiZANidine  (ZANAFLEX ) 4 MG tablet   traMADol  (ULTRAM ) 50 MG tablet   Primary osteoarthritis of left knee   Relevant Medications   methylPREDNISolone  acetate (DEPO-MEDROL ) injection 40 mg   predniSONE  (DELTASONE ) 20 MG tablet (Start on 04/03/2024)   tiZANidine  (ZANAFLEX ) 4 MG tablet   traMADol  (ULTRAM ) 50 MG tablet   Total knee replacement status   Relevant Medications   traMADol  (ULTRAM ) 50 MG tablet  Other Visit Diagnoses       Acute left-sided low back pain with left-sided sciatica    -  Primary   Relevant Medications   methylPREDNISolone  acetate (DEPO-MEDROL ) injection 40 mg   predniSONE  (DELTASONE ) 20 MG tablet (Start on 04/03/2024)   tiZANidine  (ZANAFLEX ) 4 MG tablet   traMADol  (ULTRAM ) 50 MG tablet       Assessment and Plan Assessment & Plan       No follow-ups on file.         Rockie Agent, MD  [facility] [phone] (phone) [phone] (fax)  [facility]

## 2024-04-02 NOTE — Assessment & Plan Note (Signed)
 Lumbar radiculopathy with left-sided sciatica Acute exacerbation of left-sided sciatica with pain radiating from the left buttock to the heel, accompanied by numbness and a prickly sensation. Positive straight leg raise test on the left side and weakness in the left lower extremity with strength of 3/5. Previous treatment with prednisone  and tizanidine  provided relief, but symptoms have returned. She is eager to avoid surgery, especially around the Christmas period. - Administered 40 mg dose of doprimedrol injection. - Prescribed prednisone  for one week. - Refilled tizanidine  4 mg every six hours as needed. - Refilled tramadol  50 mg every twelve hours. - Encouraged scheduling an appointment with neurosurgery, preferably before the new year.

## 2024-04-04 ENCOUNTER — Inpatient Hospital Stay
Admission: RE | Admit: 2024-04-04 | Discharge: 2024-04-04 | Disposition: A | Discharge status: Home / Self Care | Payer: Self-pay | Source: Ambulatory Visit | Attending: Orthopedic Surgery | Admitting: Orthopedic Surgery

## 2024-04-04 ENCOUNTER — Other Ambulatory Visit: Payer: Self-pay

## 2024-04-04 ENCOUNTER — Encounter: Payer: Self-pay | Admitting: Orthopedic Surgery

## 2024-04-04 DIAGNOSIS — Z049 Encounter for examination and observation for unspecified reason: Secondary | ICD-10-CM

## 2024-04-04 NOTE — Progress Notes (Signed)
 Referring Physician:  Sharma Coyer, MD [address] [place],  [place] [address]  Primary Physician:  Sharma Coyer, MD  History of Present Illness: 04/05/2024 Melissa [name] has a history of HTN, GAD, depression, GERD, prediabetes.   She has history of PSF/XLIF L4-L5 by Dr. Clois on 09/12/19.  3-4 month history of constant left sided LBP with left posterior leg pain to her foot. She has severe pain in left heel and can't put weight on it. No right leg pain. She has numbness, tingling, and weakness in left leg. Pain is worse with increased activity, standing. Some relief with medications.   She had local steroid injection and dose pack in October and pain improved for about a month but then came back.    On 04/02/24, PCP gave her steroid injection, prednisone  x 7 days, ultram , and zanaflex . These have helped her LBP but not her left foot pain.  Tobacco use: Does not smoke.   Bowel/Bladder Dysfunction: none  Conservative measures:  Physical therapy:  has not participated in Multimodal medical therapy including regular antiinflammatories:  Prednisone , Tramadol , Tizanidine , Voltaren gel Injections:  12/30/2023- : Large Joint Injection: L greater trochanteric bursa  02/13/2019: Right S1 transforaminal ESI (relief only for 1-2 days) 01/16/2019: Right L5-S1 transforaminal ESI (flare of pain, no relief)   Past Surgery:  09/12/2019- L4-5 XLIF/PSF by Dr. Clois   The symptoms are causing a significant impact on the patient's life.   Review of Systems:  A 10 point review of systems is negative, except for the pertinent positives and negatives detailed in the HPI.  Past Medical History: Past Medical History:  Diagnosis Date   Anterolisthesis    Anxiety    Arthritis    Depression    Difficult extubation 03/24/2022   a.) unexpected per anesthesia notes; declined neuraxial anesthetic course   DOE (dyspnea on exertion)    GERD  (gastroesophageal reflux disease)    Headache    cervical   Hiatal hernia    HTN (hypertension)    Lumbar radiculopathy    PONV (postoperative nausea and vomiting)    Precancerous skin lesion (nose)    Prediabetes    Spinal stenosis     Past Surgical History: Past Surgical History:  Procedure Laterality Date   BACK SURGERY     lumbar area   JOINT REPLACEMENT  03/24/2022, 12/03/2022   KNEE ARTHROPLASTY Right 03/24/2022   Procedure: COMPUTER ASSISTED TOTAL KNEE ARTHROPLASTY;  Surgeon: Mardee Lynwood SQUIBB, MD;  Location: [facility] ORS;  Service: Orthopedics;  Laterality: Right;   KNEE ARTHROPLASTY Left 12/03/2022   Procedure: COMPUTER ASSISTED TOTAL KNEE ARTHROPLASTY;  Surgeon: Mardee Lynwood SQUIBB, MD;  Location: [facility] ORS;  Service: Orthopedics;  Laterality: Left;   SPINE SURGERY  09/12/2019   L4-5 lateral lumbar interbody fusion with posterior fixtation 09/12/2019   WISDOM TOOTH EXTRACTION      Allergies: Allergies as of 04/05/2024 - Review Complete 04/05/2024  Allergen Reaction Noted   Celecoxib  Other (See Comments) 01/13/2023    Medications: Outpatient Encounter Medications as of 04/05/2024  Medication Sig   fluticasone  (FLONASE ) 50 MCG/ACT nasal spray Place 2 sprays into both nostrils daily.   ibuprofen (ADVIL) 200 MG tablet Take 600 mg by mouth in the morning and at bedtime.   predniSONE  (DELTASONE ) 20 MG tablet Take 2 tablets (40 mg total) by mouth daily with breakfast for 7 days.   tiZANidine  (ZANAFLEX ) 4 MG tablet Take 1 tablet (4 mg total) by mouth every 6 (six) hours  as needed for muscle spasms.   traMADol  (ULTRAM ) 50 MG tablet Take 1 tablet (50 mg total) by mouth every 12 (twelve) hours as needed.   [DISCONTINUED] diclofenac (VOLTAREN) 50 MG EC tablet Take 50 mg by mouth 2 (two) times daily with a meal.   No facility-administered encounter medications on file as of 04/05/2024.    Social History: Social History   Tobacco Use   Smoking status: Former    Current packs/day:  0.00    Types: Cigarettes    Quit date: 2006    Years since quitting: 19.9   Smokeless tobacco: Never  Vaping Use   Vaping status: Never Used  Substance Use Topics   Alcohol use: No   Drug use: No    Family Medical History: Family History  Problem Relation Age of Onset   Stroke Mother    Dementia Mother    Anxiety disorder Mother    Depression Mother    Congestive Heart Failure Father    Hearing loss Father    Vision loss Father    Hypertension Maternal Grandmother    Alcohol abuse Paternal Grandfather    Arthritis Paternal Grandmother    Cancer Maternal Uncle    Obesity Maternal Aunt    Varicose Veins Maternal Aunt    Breast cancer Neg Hx     Physical Examination: Vitals:   04/05/24 1048  BP: 124/82    General: Patient is well developed, well nourished, calm, collected, and in no apparent distress. Attention to examination is appropriate.  Respiratory: Patient is breathing without any difficulty.   NEUROLOGICAL:     Awake, alert, oriented to person, place, and time.  Speech is clear and fluent. Fund of knowledge is appropriate.   Cranial Nerves: Pupils equal round and reactive to light.  Facial tone is symmetric.   No posterior lumbar tenderness.   Well healed lumbar incisions.   No abnormal lesions on exposed skin.   Strength: Side Iliopsoas Quads Hamstring PF DF EHL  R 5 5 5 5 5 5   L 5 5 5 5 5 5    Reflexes are 2+ and symmetric at the patella and achilles.    Clonus is not present.   Bilateral lower extremity sensation is intact to light touch, diminished left lateral knee.   She has point tenderness at the bottom of her heel on left.   She limps favoring left leg.    Medical Decision Making  Imaging: No recent lumbar imaging.   Assessment and Plan: Melissa [name] has a history of PSF/XLIF L4-L5 by Dr. Clois on 09/12/19.   3-4 month history of constant left sided LBP with left posterior leg pain to her foot. She has severe pain in left  heel and can't put weight on it. No right leg pain.   No recent lumbar imaging. She has point tenderness in the right heel, this may be more localized and not lumbar mediated.   Treatment options discussed with patient and following plan made:   - Lumbar xrays with flex/ext done on her way out. Will message her with results.  - MRI of lumbar spine to further evaluate back and left leg pain.  - Finish out prednisone  from PCP. Continue on prn zanaflex  and prn ultram  from PCP.  - Depending on above imaging, may consider referral to podiatry/ortho for heel pain.  - Will schedule follow up visit to review MRI results once I get them back. Can do in person, telephone, or MyChart visit.   I  spent a total of 25 minutes in face-to-face and non-face-to-face activities related to this patient's care today including review of outside records, review of imaging, review of symptoms, physical exam, discussion of differential diagnosis, discussion of treatment options, and documentation.   Glade Boys PA-C Dept. of Neurosurgery

## 2024-04-05 ENCOUNTER — Ambulatory Visit

## 2024-04-05 ENCOUNTER — Encounter: Payer: Self-pay | Admitting: Orthopedic Surgery

## 2024-04-05 ENCOUNTER — Ambulatory Visit: Admitting: Orthopedic Surgery

## 2024-04-05 VITALS — BP 124/82 | Wt 251.8 lb

## 2024-04-05 DIAGNOSIS — M545 Low back pain, unspecified: Secondary | ICD-10-CM

## 2024-04-05 DIAGNOSIS — Z981 Arthrodesis status: Secondary | ICD-10-CM | POA: Diagnosis not present

## 2024-04-05 DIAGNOSIS — M5416 Radiculopathy, lumbar region: Secondary | ICD-10-CM

## 2024-04-05 DIAGNOSIS — M79672 Pain in left foot: Secondary | ICD-10-CM | POA: Diagnosis not present

## 2024-04-05 DIAGNOSIS — M5442 Lumbago with sciatica, left side: Secondary | ICD-10-CM

## 2024-04-05 NOTE — Patient Instructions (Signed)
 It was so nice to see you today. Thank you so much for coming in.    We did lumbar xrays today. I will message you with results.   I want to get an MRI of your lower back to look into things further. We will get this approved through your insurance and [facility] will call you to schedule the appointment. Ask about your patient responsibility. You do not need to pay this prior to getting MRI, they can bill you.   [facility] is located at [address] in [place]. This is near the intersection of [address] and [address].   After you have the MRI, it can take 14-28 days for me to get the results back. If I don't have them in 2 weeks, we will call to try to get the results.   Once I have the results, we will call you to schedule a follow up visit with me to review them. Can do phone visit, in person visit, or mychart visit.   Finish out the steroids from your PCP.   Please do not hesitate to call if you have any questions or concerns. You can also message me in MyChart.   Glade Boys PA-C [phone]     The physicians and staff at [facility] at [facility] are committed to providing excellent care. You may receive a survey asking for feedback about your experience at our office. We value you your feedback and appreciate you taking the time to to fill it out. The [facility] leadership team is also available to discuss your experience in person, feel free to contact us  [phone].

## 2024-04-15 ENCOUNTER — Encounter: Payer: Self-pay | Admitting: Orthopedic Surgery

## 2024-04-15 NOTE — Telephone Encounter (Signed)
 Lumbar xrays dated 04/05/24:  FINDINGS: Five lumbar type vertebra. There is no acute fracture subluxation of the lumbar spine. L4-L5 disc spacer and posterior fusion. The bones are osteopenic. Multilevel degenerative changes with disc space narrowing. Atherosclerotic calcification of the abdominal aorta. The soft tissues unremarkable   IMPRESSION: 1. No acute findings. 2. L4-L5 disc spacer and posterior fusion.     Electronically Signed   By: Vanetta Chou M.D.   On: 04/15/2024 15:57  I have personally reviewed the images and agree with the above interpretation.

## 2024-04-17 ENCOUNTER — Ambulatory Visit
Admission: RE | Admit: 2024-04-17 | Discharge: 2024-04-17 | Disposition: A | Discharge status: Home / Self Care | Source: Ambulatory Visit | Attending: Orthopedic Surgery | Admitting: Orthopedic Surgery

## 2024-04-17 DIAGNOSIS — M5442 Lumbago with sciatica, left side: Secondary | ICD-10-CM

## 2024-04-17 DIAGNOSIS — Z981 Arthrodesis status: Secondary | ICD-10-CM

## 2024-04-17 DIAGNOSIS — M5416 Radiculopathy, lumbar region: Secondary | ICD-10-CM

## 2024-04-17 DIAGNOSIS — M79672 Pain in left foot: Secondary | ICD-10-CM

## 2024-05-03 NOTE — Progress Notes (Signed)
 "  Telephone Visit- Progress Note: Referring Physician:  Sharma Coyer, MD [address] [place],  [place] [address]  Primary Physician:  Melissa Coyer, MD  This visit was performed via telephone.  Patient location: home Provider location: working from home  I spent a total of 10 minutes non-face-to-face activities for this visit on the date of this encounter including review of current clinical condition and response to treatment.    Patient has given verbal consent to this telephone visits and we reviewed the limitations of a telephone visit. Patient wishes to proceed.    Chief Complaint:  review imaging  History of Present Illness: Melissa [name] has a history of HTN, GAD, depression, GERD, prediabetes.    She has history of PSF/XLIF L4-L5 by Dr. Clois on 09/12/19.  Last seen by me on 04/05/24 for 3-4 months of constant left sided LBP with left posterior pain to her foot and severe pain in left heel.   Lumbar xrays showed fuion at L4-L5 with lumbar spondylosis and DDD. She had tenderness in heel at visit and I wasn't convinced this pain was lumbar mediated.   Phone visit scheduled to review her lumbar MRI.   She continues with constant left sided LBP with pain in left buttock. No left leg pain, but she has numbness and tingling in left leg. Left heel pain is better. No weakness in left leg. Pain is worse with increased activity, standing. Had flare up after taking down decorations. Some relief with heating pad.   She is taking tizanidine , motrin, and ultram  prn.    Tobacco use: Does not smoke.    Bowel/Bladder Dysfunction: none   Conservative measures:  Physical therapy:  has not participated in Multimodal medical therapy including regular antiinflammatories:  Prednisone , Tramadol , Tizanidine , Voltaren gel Injections:  12/30/2023- : Large Joint Injection: L greater trochanteric bursa  02/13/2019: Right S1 transforaminal ESI  (relief only for 1-2 days) 01/16/2019: Right L5-S1 transforaminal ESI (flare of pain, no relief)    Past Surgery:  09/12/2019- L4-5 XLIF/PSF by Dr. Clois    The symptoms are causing a significant impact on the patient's life.    Exam: No exam done as this was a telephone encounter.     Imaging: Lumbar MRI dated 04/17/24:  FINDINGS:   BONES AND ALIGNMENT: The patient is again noted to be status post interbody fusion and bilateral posterolateral spinal fixation at L4-L5. There is slight degenerative anterolisthesis again demonstrated at L4-L5. Slight degenerative retrolisthesis is present at L1-L2 and L2-L3. Disc space narrowing is noted at T12-L1 and L5-S1. Mild facet hypertrophy is present at L5-S1. Normal vertebral body heights. Bone marrow signal is unremarkable.   SPINAL CORD: The conus medullaris terminates at the lower body of L1.   SOFT TISSUES: No paraspinal mass.   T12-L1: Disc space narrowing but no disc herniation, spinal canal or neural foraminal stenosis.   L1-L2: Slight degenerative retrolisthesis with mild disc bulging, resulting in mild central spinal canal stenosis and bilateral lateral recess stenosis.   L2-L3: Slight degenerative retrolisthesis with mild-to-moderate central spinal canal stenosis and bilateral lateral recess stenosis, but no apparent nerve root impingement.   L3-L4: Diffuse disc bulging with mild-to-moderate central spinal canal stenosis and bilateral lateral recess stenosis. No apparent nerve root impingement.   L4-L5: Status post interbody fusion and bilateral posterolateral spinal fixation. Slight degenerative anterolisthesis. No significant disc herniation. No spinal canal stenosis or neural foraminal narrowing.   L5-S1: Disc space narrowing and mild facet hypertrophy. No significant  spinal canal or neural foraminal stenosis.   IMPRESSION: 1. Status post interbody fusion and bilateral posterolateral spinal fixation  at L4-5 with slight degenerative anterolisthesis at this level. 2. Mild central spinal canal stenosis and bilateral lateral recess stenosis at L1-2. 3. Mild-to-moderate central spinal canal stenosis and bilateral lateral recess stenosis at L2-3 and L3-4, without apparent nerve root impingement.   Electronically signed by: Melissa Coho MD 04/30/2024 06:50 AM EST RP Workstation: [id number]  I have personally reviewed the images and agree with the above interpretation.  Assessment and Plan: Ms. Melissa [name] has a history of PSF/XLIF L4-L5 by Dr. Clois on 09/12/19.  She is better than when I saw her last. She continues with constant left sided LBP with pain in left buttock. No left leg pain, but she has numbness and tingling in left leg. Left heel pain is better. No weakness in left leg.   Right now, her worst pain is her left buttock pain.   She has known fusion at L4-L5 that looked good on xray. She has retrolisthesis L1-L3 with diffuse lumbar DDD. She has mild central stenosis L1-L2, mild/moderate central stenosis L2-L4 and multilevel foraminal stenosis.    Treatment options discussed with patient and following plan made:   - Referral back to Dr. Avanell for lumbar injections.  - Discussed PT for lower back and she declines for now.  - Continue on prn zanaflex  and prn ultram  from PCP.  - She requests to follow up with me prn.   Melissa Boys PA-C Neurosurgery "

## 2024-05-04 ENCOUNTER — Ambulatory Visit: Admitting: Orthopedic Surgery

## 2024-05-04 ENCOUNTER — Encounter: Payer: Self-pay | Admitting: Orthopedic Surgery

## 2024-05-04 DIAGNOSIS — M48061 Spinal stenosis, lumbar region without neurogenic claudication: Secondary | ICD-10-CM

## 2024-05-04 DIAGNOSIS — M4316 Spondylolisthesis, lumbar region: Secondary | ICD-10-CM | POA: Diagnosis not present

## 2024-05-04 DIAGNOSIS — M4310 Spondylolisthesis, site unspecified: Secondary | ICD-10-CM

## 2024-05-04 DIAGNOSIS — Z981 Arthrodesis status: Secondary | ICD-10-CM | POA: Diagnosis not present

## 2024-05-04 DIAGNOSIS — M5416 Radiculopathy, lumbar region: Secondary | ICD-10-CM

## 2024-05-04 DIAGNOSIS — M5136 Other intervertebral disc degeneration, lumbar region with discogenic back pain only: Secondary | ICD-10-CM | POA: Diagnosis not present

## 2024-05-08 ENCOUNTER — Ambulatory Visit: Admitting: Orthopedic Surgery

## 2024-05-22 ENCOUNTER — Other Ambulatory Visit: Payer: Self-pay | Admitting: Family Medicine

## 2024-05-22 DIAGNOSIS — M19011 Primary osteoarthritis, right shoulder: Secondary | ICD-10-CM

## 2024-05-22 DIAGNOSIS — M1712 Unilateral primary osteoarthritis, left knee: Secondary | ICD-10-CM

## 2024-05-22 DIAGNOSIS — Z96653 Presence of artificial knee joint, bilateral: Secondary | ICD-10-CM

## 2024-05-22 NOTE — Telephone Encounter (Unsigned)
 Copied from CRM [id number]. Topic: Clinical - Medication Refill >> May 22, 2024 11:12 AM Nathanel BROCKS wrote: Medication: traMADol  (ULTRAM ) 50 MG tablet  Has the patient contacted their pharmacy? No   This is the patient's preferred pharmacy:  [facility] - [place], [place] - [address] [place] [address] Phone: [phone] Fax: [phone]  Is this the correct pharmacy for this prescription? Yes If no, delete pharmacy and type the correct one.   Has the prescription been filled recently? Yes  Is the patient out of the medication? Yes  Has the patient been seen for an appointment in the last year OR does the patient have an upcoming appointment? Yes  Can we respond through MyChart? Yes  Agent: Please be advised that Rx refills may take up to 3 business days. We ask that you follow-up with your pharmacy.

## 2024-05-23 MED ORDER — TRAMADOL HCL 50 MG PO TABS: 50.0000 mg | ORAL_TABLET | Freq: Two times a day (BID) | ORAL | 0 refills | Status: AC | PRN

## 2024-05-23 NOTE — Telephone Encounter (Signed)
 Requested medication (s) are due for refill today: yes  Requested medication (s) are on the active medication list: yes  Last refill:  04/02/24 #60/0  Future visit scheduled: yes  Notes to clinic:  Unable to refill per protocol, cannot delegate.      Requested Prescriptions  Pending Prescriptions Disp Refills   traMADol  (ULTRAM ) 50 MG tablet 60 tablet 0    Sig: Take 1 tablet (50 mg total) by mouth every 12 (twelve) hours as needed.     Not Delegated - Analgesics:  Opioid Agonists Failed - 05/23/2024 11:05 AM      Failed - This refill cannot be delegated      Failed - Urine Drug Screen completed in last 360 days      Passed - Valid encounter within last 3 months    Recent Outpatient Visits           1 month ago Acute left-sided low back pain with left-sided sciatica   [place] [facility]-Robinson, [place], MD   2 months ago Acute left-sided low back pain with left-sided sciatica   [place] [facility] Simmons-Robinson, [place], MD   3 months ago Encounter for annual wellness visit (AWV) in Medicare patient   [facility] Simmons-Robinson, [place], MD   5 months ago BMI 40.0-44.9, adult [facility])   [place] [facility] Simmons-Robinson, [place], MD   7 months ago BMI 40.0-44.9, adult [facility])   [facility] Simmons-Robinson, [place], MD

## 2024-05-23 NOTE — Telephone Encounter (Signed)
 Requested medication (s) are due for refill today: yes   Requested medication (s) are on the active medication list: yes   Last refill:  04/02/24 #60/0   Future visit scheduled: yes   Notes to clinic:  Unable to refill per protocol, cannot delegate.    Requested Prescriptions  Pending Prescriptions Disp Refills   traMADol  (ULTRAM ) 50 MG tablet 60 tablet 0    Sig: Take 1 tablet (50 mg total) by mouth every 12 (twelve) hours as needed.     Not Delegated - Analgesics:  Opioid Agonists Failed - 05/23/2024 11:06 AM      Failed - This refill cannot be delegated      Failed - Urine Drug Screen completed in last 360 days      Passed - Valid encounter within last 3 months    Recent Outpatient Visits           1 month ago Acute left-sided low back pain with left-sided sciatica   [place] [facility] Simmons-Robinson, [place], MD   2 months ago Acute left-sided low back pain with left-sided sciatica   [place] [facility] Simmons-Robinson, [place], MD   3 months ago Encounter for annual wellness visit (AWV) in Medicare patient   [place] [facility] Simmons-Robinson, [place], MD   5 months ago BMI 40.0-44.9, adult [facility])   [place] [facility] Simmons-Robinson, [place], MD   7 months ago BMI 40.0-44.9, adult [facility])   [place] [facility] Simmons-Robinson, [place], MD

## 2024-06-12 ENCOUNTER — Ambulatory Visit: Admitting: Family Medicine
